# Patient Record
Sex: Male | Born: 1975 | Race: Black or African American | Hispanic: No | Marital: Married | Smoking: Former smoker
Health system: Southern US, Community
[De-identification: ages and names within clinical notes are randomized; demographics above are authoritative.]

## PROBLEM LIST (undated history)

## (undated) ENCOUNTER — Emergency Department (HOSPITAL_COMMUNITY): Discharge status: Against Medical Advice | Payer: Self-pay

## (undated) DIAGNOSIS — K219 Gastro-esophageal reflux disease without esophagitis: Secondary | ICD-10-CM

## (undated) DIAGNOSIS — K2970 Gastritis, unspecified, without bleeding: Secondary | ICD-10-CM

## (undated) DIAGNOSIS — B9681 Helicobacter pylori [H. pylori] as the cause of diseases classified elsewhere: Secondary | ICD-10-CM

## (undated) HISTORY — DX: Gastritis, unspecified, without bleeding: K29.70

## (undated) HISTORY — DX: Helicobacter pylori (H. pylori) as the cause of diseases classified elsewhere: B96.81

---

## 2010-07-22 ENCOUNTER — Encounter: Payer: Self-pay | Admitting: Urgent Care

## 2010-07-22 ENCOUNTER — Ambulatory Visit: Payer: Self-pay | Admitting: Urgent Care

## 2010-07-22 ENCOUNTER — Ambulatory Visit (INDEPENDENT_AMBULATORY_CARE_PROVIDER_SITE_OTHER): Payer: 59 | Admitting: Urgent Care

## 2010-07-22 VITALS — BP 137/77 | HR 80 | Temp 97.5°F | Ht 74.0 in | Wt 207.6 lb

## 2010-07-22 DIAGNOSIS — R197 Diarrhea, unspecified: Secondary | ICD-10-CM

## 2010-07-22 DIAGNOSIS — K625 Hemorrhage of anus and rectum: Secondary | ICD-10-CM

## 2010-07-22 DIAGNOSIS — K529 Noninfective gastroenteritis and colitis, unspecified: Secondary | ICD-10-CM | POA: Insufficient documentation

## 2010-07-22 DIAGNOSIS — K219 Gastro-esophageal reflux disease without esophagitis: Secondary | ICD-10-CM

## 2010-07-22 NOTE — Progress Notes (Signed)
Referring Provider: Josefa Half* Primary Care Physician:  n/a Primary Gastroenterologist:  Dr. Darrick Penna  Chief Complaint  Patient presents with  . Rectal Bleeding   HPI:  Albert Avila is a 35 y.o. male here as a referral from Dr. Wende Crease for rectal bleeding and abdominal pain.  2 days ago at work, had loose stool with a large amt of bright red blood.  Also had low abd cramping before BM.  Has had diarrhea w/ about 3 loose stools daily for approx 4 months now.  Some heartburn that day & several times per mo.  Worse w/ spicy foods.  Intermittent diarrhea x 3-4 mo.  IBU 1-2 prn a couple times per mo.  NO other NSAIDs.  No recent abx.  Not foreign travel.  No new meds.  Denies fever or chills.  Denies n/v, dysphagia or odynophagia.  Not taking any meds for stomach.  C/o burning rectal pain.  Wt stable.  Appetite ok.  Just started omeprazole yesterday.  Started cipro/flagyl yesterday too.  No past medical history on file. Denies any.  No past surgical history on file. Denies any.  Current Outpatient Prescriptions  Medication Sig Dispense Refill  . ciprofloxacin (CIPRO) 500 MG tablet Take 500 mg by mouth 2 (two) times daily.        . metroNIDAZOLE (FLAGYL) 500 MG tablet Take 500 mg by mouth 3 (three) times daily.        Marland Kitchen omeprazole (PRILOSEC) 40 MG capsule Take 40 mg by mouth daily.         Allergies as of 07/22/2010  . (No Known Allergies)   Family History  Problem Relation Age of Onset  . Breast cancer Mother   . Cancer Father     ? GI-unsure   History   Social History  . Marital Status: Married    Spouse Name: N/A    Number of Children: 3  . Years of Education: N/A   Occupational History  . truck driver     [name]   Social History Main Topics  . Smoking status: Current Everyday Smoker -- 0.5 packs/day for 10 years    Types: Cigarettes  . Smokeless tobacco: Not on file  . Alcohol Use: Yes     socially, couple beers about a 6 pk/wk  . Drug Use: No  . Sexually  Active: Not on file  Review of Systems: Gen: Denies any fever, chills, sweats, anorexia, fatigue, weakness, malaise, weight loss, and sleep disorder CV: Denies chest pain, angina, palpitations, syncope, orthopnea, PND, peripheral edema, and claudication. Resp: Denies dyspnea at rest, dyspnea with exercise, cough, sputum, wheezing, coughing up blood, and pleurisy. GI: Denies vomiting blood, jaundice, and fecal incontinence.   Denies dysphagia or odynophagia. GU : Denies urinary burning, blood in urine, urinary frequency, urinary hesitancy, nocturnal urination, and urinary incontinence. MS: Denies joint pain, limitation of movement, and swelling, stiffness, low back pain, extremity pain. Denies muscle weakness, cramps, atrophy.  Derm: Denies rash, itching, dry skin, hives, moles, warts, or unhealing ulcers.  Psych: Denies depression, anxiety, memory loss, suicidal ideation, hallucinations, paranoia, and confusion. Heme: Denies bruising and enlarged lymph nodes.  Physical Exam: BP 137/77  Pulse 80  Temp(Src) 97.5 F (36.4 C) (Temporal)  Ht 6\' 2"  (1.88 m)  Wt 207 lb 9.6 oz (94.167 kg)  BMI 26.65 kg/m2 General:   Alert,  Well-developed, well-nourished, pleasant and cooperative in NAD. Head:  Normocephalic and atraumatic. Eyes:  Sclera clear, no icterus.   Conjunctiva pink. Ears:  Normal auditory acuity. Nose:  No deformity, discharge,  or lesions. Mouth:  No deformity or lesions, dentition normal. Neck:  Supple; no masses or thyromegaly. Lungs:  Clear throughout to auscultation.   No wheezes, crackles, or rhonchi. No acute distress. Heart:  Regular rate and rhythm; no murmurs, clicks, rubs,  or gallops. Abdomen:  Soft, nontender and nondistended. No masses, hepatosplenomegaly or hernias noted. Normal bowel sounds, without guarding, and without rebound.   Rectal: No internal or external lesions palpated.  Rectal vault clear. Msk:  Symmetrical without gross deformities. Normal  posture. Pulses:  Normal pulses noted. Extremities:  Without clubbing or edema. Neurologic:  Alert and  oriented x4;  grossly normal neurologically. Skin:  Intact without significant lesions or rashes. Cervical Nodes:  No significant cervical adenopathy. Psych:  Alert and cooperative. Normal mood and affect.

## 2010-07-22 NOTE — Assessment & Plan Note (Addendum)
Heartburn several days per week.  Just started PPI.  Advised takes up to 10 days to be effective.  Seems to be helping some.  If significant drop in hgb or nothing found on colonoscopy to explain bleeding, would consider EGD to r/o PUD or gastritis.  Continue omeprazole 20mg  daily

## 2010-07-22 NOTE — Assessment & Plan Note (Signed)
See diarrhea  Check CBC, CMP

## 2010-07-22 NOTE — Assessment & Plan Note (Signed)
Albert Avila is a 35 y.o. black male w/ 4 month hx chronic diarrhea & has now had an episode of large volume bright red bleeding this week.  Rectal exam benign.  Differentials include ischemic or infectious colitis, inflammatory bowel disease, diverticular bleeding, or colorectal polyp or cancer. I doubt rapid transit upper GI source.  Full set of stool studies Colonoscopy w/ Dr Darrick Penna.  I have discussed risks & benefits which include, but are not limited to, bleeding, infection, perforation & drug reaction.  The patient agrees with this plan & written consent will be obtained.

## 2010-07-22 NOTE — Patient Instructions (Addendum)
Return stools as soon as possible to the lab Continue omeprazole 20mg  daily Hold cipro & flagyl until stools return Bloody Stools Blood in the poop (bloody stools) is a sign that there is a problem somewhere in the digestive system. The blood might be black, medium red, or bright red. You might also have pain, weakness, throwing up (vomiting), or fever. There are many things that can cause blood in the poop. It is important to figure out what is causing the bleeding. Your doctor might do some tests to figure out what is wrong. When your doctor knows why the bleeding is happening, you will get help to stop the bleeding. HOME CARE  Take medicines, as told by your doctor.   Eat food with lots of fiber (like prunes and bran cereals) and drink lots of liquids.   Sit in a bathtub with warm water for 10-15 minutes, to soak and clean the area, as told by your doctor.   Watch for signs that you are getting better or getting worse.  GET HELP RIGHT AWAY IF:  Your bleeding continues or gets worse.   You throw up (vomit) blood.   You feel weak or dizzy.   You have a temperature by mouth above 101, not controlled by medicine.   You have strong pain.  MAKE SURE YOU:   Understand these instructions.   Will watch your condition.   Will get help right away if you are not doing well or get worse.  Document Released: 12/08/2008 Document Re-Released: 01/11/2009 [facility] Patient Information 2011 [place], [place]. Chronic Diarrhea Diarrhea is loose, watery stools. Having diarrhea means passing loose stools 3 or more times a day. Diarrhea that lasts longer than 4 weeks is considered long-lasting (chronic). Symptoms of chronic diarrhea may be continual or may come and go. People of all ages can get diarrhea. Body fluid loss (dehydration) may occur as a result of diarrhea. This means the body does not have as many fluids and salts (electrolytes) as it needs. CAUSES There are many causes of chronic  diarrhea. Causes may be different for children and adults. The various causes can be grouped into 2 categories: diarrhea caused by an infection and diarrhea not caused by an infection. Sometimes, the cause is unknown. Diarrhea caused by an infection may result from:  Parasites.   Bacteria.   Viral infections.  Diarrhea not caused by an infection may result from:  Irritable bowel syndrome.   Reaction to medicines, such as antibiotics, cancer drugs, blood pressure medicines, and antacids.   Intestinal disease (Crohn's disease, ulcerative colitis, celiac disease).   Food allergies or sensitivity to additives (fructose, lactose, sugar substitutes).   Tumors.   Diabetes, thyroid disease, and other endocrine diseases.   Reduced blood flow to the intestine.   Previous surgery or radiation of the abdomen or gastrointestinal tract.  Risk factors for chronic diarrhea include:  Having a severely weakened immune system, such as from HIV/AIDS.   Taking certain types of cancer-fighting drugs (chemotherapy) or other medicines.   A recent organ transplant.   Having a portion of the stomach removed.   Traveling to countries where food and water supplies are often contaminated.  SYMPTOMS In addition to frequent, loose stools, diarrhea may cause:  Cramping.   Abdominal pain.   Nausea.   Urgent need to use the bathroom, or loss of bowel control.  If dehydration occurs, problems include:  Thirst.   Less frequent urination.   Dark urine.   Dry skin.  Fatigue.   Dizziness.  Infections that cause diarrhea may also cause a fever, chills, or bloody stools. DIAGNOSIS Diagnosis may be difficult. Your caregiver must take a careful history and perform a physical exam. Tests given are based on your symptoms and history. Tests may include:  Blood or stool tests, in which 3 or more stool samples may be examined. Stool cultures may be used to test for bacteria or parasites.   X-rays.     A procedure in which a thin tube is inserted into the mouth or rectum (endoscopy). This allows the caregiver to look inside the intestine.  TREATMENT  Diarrhea caused by an infection can often be treated with antibiotics.   Diarrhea not caused by an infection is more difficult to diagnose and treat. Long-term medicine use or surgery may be required. Specific treatment should be discussed with your caregiver.   If the cause cannot determined, treatment to relieve symptoms includes:   Preventing dehydration. Serious health problems can occur if you do not maintain proper fluid levels. Many oral rehydration solutions (ORS) are available at drug stores. Ask your caregiver what product is best for you.   Not drinking beverages that contain caffeine (tea, coffee, soft drinks).   Not drinking alcohol. It causes dehydration.   Not relying on sports drinks and broths alone to maintain proper fluid levels. They should not be used to prevent severe dehydration.   Maintaining well-balanced nutrition. This may help you recover faster.  PREVENTION  Drink clean or purified water.   Use proper food handling techniques.   Maintain proper hand-washing habits.  HOME CARE INSTRUCTIONS  Avoid:   Caffeine.   Greasy foods.   High fiber.   If you have problems digesting lactose during or after an episode of diarrhea, you might want to try yogurt. Yogurt is often better tolerated, because it has less lactose than milk. Yogurt with active, live bacterial cultures may even help you recover faster.  SEEK MEDICAL CARE IF: The person with diarrhea is an otherwise healthy adult and has:  Signs of dehydration.   Diarrhea for more than 2 days.   Severe pain in the abdomen or rectum.   An oral temperature above 101.   Stools containing blood or pus.   Stools that are black and tarry.  SEEK IMMEDIATE MEDICAL CARE IF: The person with diarrhea is a child, elderly person, or has a weakened immune  system and has:  Signs of dehydration.   Diarrhea for more than 1 day.   Severe pain in the abdomen or rectum.   An oral temperature above 101, not controlled by medicine.   Stools containing blood or pus.   Stools that are black and tarry.  Document Released: 03/12/2003 Document Re-Released: 06/09/2009 [facility] Patient Information 2011 [place], [place].

## 2010-07-22 NOTE — Progress Notes (Signed)
Cc to Dr. Wende Crease

## 2010-07-27 ENCOUNTER — Telehealth: Payer: Self-pay

## 2010-07-27 LAB — COMPREHENSIVE METABOLIC PANEL
AST: 20 U/L (ref 0–37)
BUN: 16 mg/dL (ref 6–23)
Calcium: 8.9 mg/dL (ref 8.4–10.5)
Chloride: 108 mEq/L (ref 96–112)
Creat: 1.09 mg/dL (ref 0.50–1.35)

## 2010-07-27 LAB — CBC WITH DIFFERENTIAL/PLATELET
Basophils Relative: 1 % (ref 0–1)
HCT: 37.2 % — ABNORMAL LOW (ref 39.0–52.0)
Hemoglobin: 12.9 g/dL — ABNORMAL LOW (ref 13.0–17.0)
Lymphocytes Relative: 36 % (ref 12–46)
MCHC: 34.7 g/dL (ref 30.0–36.0)
Monocytes Absolute: 0.5 10*3/uL (ref 0.1–1.0)
Monocytes Relative: 9 % (ref 3–12)
Neutro Abs: 3.1 10*3/uL (ref 1.7–7.7)

## 2010-07-27 LAB — CLOSTRIDIUM DIFFICILE BY PCR: Toxigenic C. Difficile by PCR: DETECTED — CR

## 2010-07-27 LAB — GIARDIA ANTIGEN: Giardia Screen (EIA): NEGATIVE

## 2010-07-27 NOTE — Telephone Encounter (Signed)
Albert Avila from [facility] called and reported a positive C-Diff on pt. Pt seen by Lorenza Burton on 07/22/2010.

## 2010-07-27 NOTE — Telephone Encounter (Signed)
Pt seen by KJ. Pos CDIFF. LVM at home ph. Needs to stop CIP. Needs Flagyl for 10 days-7/24-8/2, 500 mg TID #30, rfx0. Needs TCS for rectal bleeding.

## 2010-07-28 NOTE — Telephone Encounter (Signed)
Called pt at home to answer questions RE: C DIFF. LVM-call [phone] if he has questions.

## 2010-07-28 NOTE — Telephone Encounter (Signed)
LMOM to call.

## 2010-07-28 NOTE — Telephone Encounter (Signed)
Cc results to Referring Physicians

## 2010-07-28 NOTE — Telephone Encounter (Signed)
Pt informed. Rx called to [facility]. Mailed info on C-Diff and he also requested a copy of his Cmet.

## 2010-07-30 LAB — STOOL CULTURE

## 2010-08-02 NOTE — Telephone Encounter (Signed)
Noted  

## 2010-08-05 ENCOUNTER — Other Ambulatory Visit: Payer: Self-pay | Admitting: Gastroenterology

## 2010-08-05 ENCOUNTER — Encounter (HOSPITAL_COMMUNITY): Payer: Self-pay | Admitting: *Deleted

## 2010-08-05 ENCOUNTER — Encounter (HOSPITAL_COMMUNITY)
Admission: RE | Disposition: A | Discharge status: Home / Self Care | Payer: Self-pay | Source: Ambulatory Visit | Attending: Gastroenterology

## 2010-08-05 ENCOUNTER — Ambulatory Visit (HOSPITAL_COMMUNITY)
Admission: RE | Admit: 2010-08-05 | Discharge: 2010-08-05 | Disposition: A | Discharge status: Home / Self Care | Payer: 59 | Source: Ambulatory Visit | Attending: Gastroenterology | Admitting: Gastroenterology

## 2010-08-05 ENCOUNTER — Encounter: Payer: 59 | Admitting: Gastroenterology

## 2010-08-05 DIAGNOSIS — D126 Benign neoplasm of colon, unspecified: Secondary | ICD-10-CM | POA: Insufficient documentation

## 2010-08-05 DIAGNOSIS — R197 Diarrhea, unspecified: Secondary | ICD-10-CM | POA: Insufficient documentation

## 2010-08-05 DIAGNOSIS — K5730 Diverticulosis of large intestine without perforation or abscess without bleeding: Secondary | ICD-10-CM

## 2010-08-05 DIAGNOSIS — D128 Benign neoplasm of rectum: Secondary | ICD-10-CM | POA: Insufficient documentation

## 2010-08-05 DIAGNOSIS — K621 Rectal polyp: Secondary | ICD-10-CM

## 2010-08-05 DIAGNOSIS — K648 Other hemorrhoids: Secondary | ICD-10-CM | POA: Insufficient documentation

## 2010-08-05 DIAGNOSIS — K921 Melena: Secondary | ICD-10-CM | POA: Insufficient documentation

## 2010-08-05 DIAGNOSIS — K620 Anal polyp: Secondary | ICD-10-CM

## 2010-08-05 HISTORY — DX: Gastro-esophageal reflux disease without esophagitis: K21.9

## 2010-08-05 HISTORY — PX: COLONOSCOPY: SHX5424

## 2010-08-05 SURGERY — COLONOSCOPY
Anesthesia: Moderate Sedation

## 2010-08-05 MED ORDER — MIDAZOLAM HCL 5 MG/5ML IJ SOLN
INTRAMUSCULAR | Status: DC | PRN
  Administered 2010-08-05 (×2): 2 mg via INTRAVENOUS
  Administered 2010-08-05: 1 mg via INTRAVENOUS

## 2010-08-05 MED ORDER — MEPERIDINE HCL 100 MG/ML IJ SOLN
INTRAMUSCULAR | Status: AC
  Filled 2010-08-05: qty 2

## 2010-08-05 MED ORDER — MIDAZOLAM HCL 5 MG/5ML IJ SOLN
INTRAMUSCULAR | Status: AC
  Filled 2010-08-05: qty 10

## 2010-08-05 MED ORDER — MEPERIDINE HCL 100 MG/ML IJ SOLN
INTRAMUSCULAR | Status: DC | PRN
  Administered 2010-08-05: 50 mg via INTRAVENOUS
  Administered 2010-08-05: 25 mg via INTRAVENOUS

## 2010-08-05 MED ORDER — STERILE WATER FOR IRRIGATION IR SOLN
Status: DC | PRN
  Administered 2010-08-05: 10:00:00

## 2010-08-05 MED ORDER — SODIUM CHLORIDE 0.45 % IV SOLN
Freq: Once | INTRAVENOUS | Status: AC
  Administered 2010-08-05: 10:00:00 via INTRAVENOUS

## 2010-08-05 NOTE — Progress Notes (Signed)
Cc to PCP 

## 2010-08-05 NOTE — Progress Notes (Signed)
cdiff pcr pos. tcs for rectal bleeding and normocytic anemia. opv in 4 mos and repeat cbc.

## 2010-08-05 NOTE — Progress Notes (Signed)
Pt aware of OV for 12/08/10 @ 245 w/SF and reminder in epic for pt to have CBC done prior to OV

## 2010-08-05 NOTE — H&P (Signed)
Reason for Visit     Rectal Bleeding        Current Vitals       Recorded User        07/22/2010  1:19 PM  Ginger L Walker, NT           BP Pulse Temp (Src) Resp Ht Wt    137/77  80  97.5 F (36.4 C) (Temporal)  N/A  6\' 2"  (1.88 m)  207 lb 9.6 oz (94.167 kg)       BMI SpO2 PF    26.65 kg/m2  N/A  N/A          Progress Notes     Lorenza Burton, NP  07/22/2010  2:53 PM  Signed Referring Provider: Josefa Half* Primary Care Physician:  n/a Primary Gastroenterologist:  Dr. Darrick Penna    Chief Complaint   Patient presents with   .  Rectal Bleeding    HPI:  Albert Avila is a 35 y.o. male here as a referral from Dr. Wende Crease for rectal bleeding and abdominal pain.  2 days ago at work, had loose stool with a large amt of bright red blood.  Also had low abd cramping before BM.  Has had diarrhea w/ about 3 loose stools daily for approx 4 months now.  Some heartburn that day & several times per mo.  Worse w/ spicy foods.  Intermittent diarrhea x 3-4 mo.  IBU 1-2 prn a couple times per mo.  NO other NSAIDs.  No recent abx.  Not foreign travel.  No new meds.  Denies fever or chills.  Denies n/v, dysphagia or odynophagia.  Not taking any meds for stomach.  C/o burning rectal pain.  Wt stable.  Appetite ok.  Just started omeprazole yesterday.  Started cipro/flagyl yesterday too.   No past medical history on file. Denies any.   No past surgical history on file. Denies any.    Current Outpatient Prescriptions   Medication  Sig  Dispense  Refill   .  ciprofloxacin (CIPRO) 500 MG tablet  Take 500 mg by mouth 2 (two) times daily.           .  metroNIDAZOLE (FLAGYL) 500 MG tablet  Take 500 mg by mouth 3 (three) times daily.           Marland Kitchen  omeprazole (PRILOSEC) 40 MG capsule  Take 40 mg by mouth daily.            Allergies as of 07/22/2010   .  (No Known Allergies)    Family History   Problem  Relation  Age of Onset   .  Breast cancer  Mother     .  Cancer  Father         ?  GI-unsure    History       Social History   .  Marital Status:  Married       Spouse Name:  N/A       Number of Children:  3   .  Years of Education:  N/A       Occupational History   .  truck driver         [name]       Social History Main Topics   .  Smoking status:  Current Everyday Smoker -- 0.5 packs/day for 10 years       Types:  Cigarettes   .  Smokeless tobacco:  Not on file   .  Alcohol Use:  Yes         socially, couple beers about a 6 pk/wk   .  Drug Use:  No   .  Sexually Active:  Not on file    Review of Systems: Gen: Denies any fever, chills, sweats, anorexia, fatigue, weakness, malaise, weight loss, and sleep disorder CV: Denies chest pain, angina, palpitations, syncope, orthopnea, PND, peripheral edema, and claudication. Resp: Denies dyspnea at rest, dyspnea with exercise, cough, sputum, wheezing, coughing up blood, and pleurisy. GI: Denies vomiting blood, jaundice, and fecal incontinence.   Denies dysphagia or odynophagia. GU : Denies urinary burning, blood in urine, urinary frequency, urinary hesitancy, nocturnal urination, and urinary incontinence. MS: Denies joint pain, limitation of movement, and swelling, stiffness, low back pain, extremity pain. Denies muscle weakness, cramps, atrophy.   Derm: Denies rash, itching, dry skin, hives, moles, warts, or unhealing ulcers.   Psych: Denies depression, anxiety, memory loss, suicidal ideation, hallucinations, paranoia, and confusion. Heme: Denies bruising and enlarged lymph nodes.   Physical Exam: BP 137/77  Pulse 80  Temp(Src) 97.5 F (36.4 C) (Temporal)  Ht 6\' 2"  (1.88 m)  Wt 207 lb 9.6 oz (94.167 kg)  BMI 26.65 kg/m2 General:   Alert,  Well-developed, well-nourished, pleasant and cooperative in NAD. Head:  Normocephalic and atraumatic. Eyes:  Sclera clear, no icterus.   Conjunctiva pink. Ears:  Normal auditory acuity. Nose:  No deformity, discharge,  or lesions. Mouth:  No deformity or lesions,  dentition normal. Neck:  Supple; no masses or thyromegaly. Lungs:  Clear throughout to auscultation.   No wheezes, crackles, or rhonchi. No acute distress. Heart:  Regular rate and rhythm; no murmurs, clicks, rubs,  or gallops. Abdomen:  Soft, nontender and nondistended. No masses, hepatosplenomegaly or hernias noted. Normal bowel sounds, without guarding, and without rebound.    Rectal: No internal or external lesions palpated.  Rectal vault clear. Msk:  Symmetrical without gross deformities. Normal posture. Pulses:  Normal pulses noted. Extremities:  Without clubbing or edema. Neurologic:  Alert and  oriented x4;  grossly normal neurologically. Skin:  Intact without significant lesions or rashes. Cervical Nodes:  No significant cervical adenopathy. Psych:  Alert and cooperative. Normal mood and affect.     Glendora Score  07/22/2010  3:06 PM  Signed Cc to Dr. Wende Crease        Chronic diarrhea - Lorenza Burton, NP  07/22/2010  2:47 PM  Signed Albert Avila is a 35 y.o. black male w/ 4 month hx chronic diarrhea & has now had an episode of large volume bright red bleeding this week.  Rectal exam benign.  Differentials include ischemic or infectious colitis, inflammatory bowel disease, diverticular bleeding, or colorectal polyp or cancer. I doubt rapid transit upper GI source.   Full set of stool studies Colonoscopy w/ Dr Darrick Penna.  I have discussed risks & benefits which include, but are not limited to, bleeding, infection, perforation & drug reaction.  The patient agrees with this plan & written consent will be obtained.       Rectal bleeding - Lorenza Burton, NP  07/22/2010  2:47 PM  Signed See diarrhea   Check CBC, CMP  GERD (gastroesophageal reflux disease) - Lorenza Burton, NP  07/22/2010  2:50 PM  Addendum Heartburn several days per week.  Just started PPI.  Advised takes up to 10 days to be effective.  Seems to be helping some.  If significant drop in hgb or nothing found on colonoscopy  to explain bleeding,  would consider EGD to r/o PUD or gastritis.   Continue omeprazole 20mg  daily

## 2010-08-05 NOTE — Interval H&P Note (Signed)
History and Physical Interval Note: CDIFF PCR POS. STOOL CX NEG, NORMOCYTIC ANEMIA in setting of rectal bleeding.    08/05/2010   9:46 AM   Albert Avila  has presented today for surgery, with the diagnosis of diarrhea and rectal bleeding.  The various methods of treatment have been discussed with the patient and family. After consideration of risks, benefits and other options for treatment, the patient has consented to  Procedure(s): COLONOSCOPY as a surgical intervention .  I have reviewed the patients' chart and labs.  Questions were answered to the patient's satisfaction.     [name]  MD

## 2010-08-10 ENCOUNTER — Telehealth: Payer: Self-pay | Admitting: Gastroenterology

## 2010-08-10 NOTE — Telephone Encounter (Signed)
Reminder in epic to have next tcs in 10 years °

## 2010-08-10 NOTE — Telephone Encounter (Signed)
Please call pt. H ehad hyperplastic polyps removed. TCS in 10 years. High fiber diet.

## 2010-08-10 NOTE — Telephone Encounter (Signed)
Results Cc to Ref Dr. Wende Crease

## 2010-08-11 ENCOUNTER — Encounter (HOSPITAL_COMMUNITY): Payer: Self-pay | Admitting: Gastroenterology

## 2010-08-11 NOTE — Telephone Encounter (Signed)
Pt aware.

## 2010-09-23 ENCOUNTER — Encounter: Payer: Self-pay | Admitting: Gastroenterology

## 2010-10-06 ENCOUNTER — Telehealth: Payer: Self-pay | Admitting: Gastroenterology

## 2010-10-06 NOTE — Telephone Encounter (Signed)
Pt is aware of OV for 11/1 at 245 with SF, but needs CBC prior to OV

## 2010-10-07 ENCOUNTER — Other Ambulatory Visit: Payer: Self-pay

## 2010-10-07 DIAGNOSIS — K625 Hemorrhage of anus and rectum: Secondary | ICD-10-CM

## 2010-10-07 NOTE — Telephone Encounter (Signed)
Mailed lab order to pt to get done prior t office visit on 11/04/2010.

## 2010-10-21 ENCOUNTER — Other Ambulatory Visit: Payer: Self-pay | Admitting: Gastroenterology

## 2010-10-22 LAB — CBC WITH DIFFERENTIAL/PLATELET
Basophils Absolute: 0 10*3/uL (ref 0.0–0.1)
Basophils Relative: 0 % (ref 0–1)
Eosinophils Absolute: 0.2 10*3/uL (ref 0.0–0.7)
Eosinophils Relative: 2 % (ref 0–5)
HCT: 39.8 % (ref 39.0–52.0)
Hemoglobin: 13.6 g/dL (ref 13.0–17.0)
Lymphocytes Relative: 28 % (ref 12–46)
Lymphs Abs: 2.3 10*3/uL (ref 0.7–4.0)
MCH: 31.8 pg (ref 26.0–34.0)
MCHC: 34.2 g/dL (ref 30.0–36.0)
MCV: 93 fL (ref 78.0–100.0)
Monocytes Absolute: 0.7 10*3/uL (ref 0.1–1.0)
Monocytes Relative: 9 % (ref 3–12)
Neutro Abs: 5 10*3/uL (ref 1.7–7.7)
Neutrophils Relative %: 61 % (ref 43–77)
Platelets: 248 10*3/uL (ref 150–400)
RBC: 4.28 MIL/uL (ref 4.22–5.81)
RDW: 13.2 % (ref 11.5–15.5)
WBC: 8.2 10*3/uL (ref 4.0–10.5)

## 2010-11-01 NOTE — Progress Notes (Signed)
Quick Note:  Forwarding to Ginger to call. ______ 

## 2010-11-04 ENCOUNTER — Ambulatory Visit (INDEPENDENT_AMBULATORY_CARE_PROVIDER_SITE_OTHER): Payer: 59 | Admitting: Gastroenterology

## 2010-11-04 ENCOUNTER — Encounter: Payer: Self-pay | Admitting: Gastroenterology

## 2010-11-04 DIAGNOSIS — K529 Noninfective gastroenteritis and colitis, unspecified: Secondary | ICD-10-CM

## 2010-11-04 DIAGNOSIS — R197 Diarrhea, unspecified: Secondary | ICD-10-CM

## 2010-11-04 DIAGNOSIS — D649 Anemia, unspecified: Secondary | ICD-10-CM

## 2010-11-04 NOTE — Progress Notes (Signed)
Cc to PCP 

## 2010-11-04 NOTE — Progress Notes (Signed)
  Subjective:    Patient ID: Albert Avila, male    DOB: September 24, 1975, 35 y.o.   MRN: [id number]  PCP: NONE  HPI WORKS [employer] AT [employer]. Dx w/ CDIFF JUL 2012. CURRENTLY, no diarrhea or abd pain. Appetite: good. No questions or concerns. No asa, BC, or Goody's, rare ibuprofen. Rare EtOH.  Past Medical History  Diagnosis Date  . GERD (gastroesophageal reflux disease)    Past Surgical History  Procedure Date  . Colonoscopy 08/05/2010    Procedure: COLONOSCOPY;  Surgeon: Arlyce Harman, MD;  Location: [facility];  Service: Endoscopy;  Laterality: N/A;    No Known Allergies  Current Outpatient Prescriptions  Medication Sig Dispense Refill  . omeprazole (PRILOSEC) 40 MG capsule Take 40 mg by mouth daily.             Review of Systems     Objective:   Physical Exam  Constitutional: He appears well-developed and well-nourished. No distress.  HENT:  Head: Normocephalic and atraumatic.  Cardiovascular: Normal rate, regular rhythm and normal heart sounds.   Pulmonary/Chest: Effort normal and breath sounds normal.  Abdominal: Soft. Bowel sounds are normal. He exhibits no distension. There is no tenderness.          Assessment & Plan:

## 2010-11-04 NOTE — Progress Notes (Signed)
Reminder in epic to follow up in 6 months °

## 2010-11-04 NOTE — Assessment & Plan Note (Signed)
RESOLVED 2o TO cdiff.

## 2010-11-04 NOTE — Assessment & Plan Note (Addendum)
No active gi bleed or infection.  OPV IN 6 MOS TO RECHECK CBC. IF ANEMIA PERSISTS, PT WILL NEED AN EGD. REFER TO DR. Jeanice Lim TO BE HIS PCP. PT DESIRES TO STOP SMOKING.

## 2010-12-08 ENCOUNTER — Ambulatory Visit: Payer: 59 | Admitting: Gastroenterology

## 2012-01-25 ENCOUNTER — Emergency Department
Admission: EM | Admit: 2012-01-25 | Discharge: 2012-01-25 | Disposition: A | Discharge status: Home / Self Care | Payer: Self-pay | Source: Home / Self Care | Attending: Family Medicine | Admitting: Family Medicine

## 2012-01-25 ENCOUNTER — Encounter: Payer: Self-pay | Admitting: Emergency Medicine

## 2012-01-25 DIAGNOSIS — Z202 Contact with and (suspected) exposure to infections with a predominantly sexual mode of transmission: Secondary | ICD-10-CM

## 2012-01-25 DIAGNOSIS — A599 Trichomoniasis, unspecified: Secondary | ICD-10-CM

## 2012-01-25 DIAGNOSIS — Z2089 Contact with and (suspected) exposure to other communicable diseases: Secondary | ICD-10-CM

## 2012-01-25 MED ORDER — METRONIDAZOLE 500 MG PO TABS: 500.0000 mg | ORAL_TABLET | Freq: Two times a day (BID) | ORAL | Status: DC

## 2012-01-25 MED ORDER — AZITHROMYCIN 500 MG PO TABS: ORAL_TABLET | ORAL | Status: DC

## 2012-01-25 NOTE — ED Notes (Signed)
STD Testing, was told by his partner that she had an Irregular Pap

## 2012-01-25 NOTE — ED Provider Notes (Signed)
History     CSN: [id number]  Arrival date & time 01/25/12  1652   First MD Initiated Contact with Patient 01/25/12 1701      Chief Complaint  Patient presents with  . SEXUALLY TRANSMITTED DISEASE   HPI  Pt presents today with possible STD exposure.  Pt reports that his girlfriend got an abnormal pap smear report.  Pt has been with this partner for the past 6 months. This has been a monogamous relationship.  Pt was married prior to this in a monogamous relationship.  No prior history of STDs per pt.  Pt is unsure of girlfriend's prior history.  No penile discharge, dysuria, inguinal LAD.    Past Medical History  Diagnosis Date  . GERD (gastroesophageal reflux disease)     Past Surgical History  Procedure Date  . Colonoscopy 08/05/2010    Procedure: COLONOSCOPY;  Surgeon: Arlyce Harman, MD;  Location: [facility];  Service: Endoscopy;  Laterality: N/A;    Family History  Problem Relation Age of Onset  . Breast cancer Mother   . Cancer Father     ? GI-unsure    History  Substance Use Topics  . Smoking status: Current Every Day Smoker -- 0.5 packs/day for 10 years    Types: Cigarettes  . Smokeless tobacco: Not on file  . Alcohol Use: Yes     Comment: socially, couple beers about a 6 pk/wk      Review of Systems  All other systems reviewed and are negative.    Allergies  Review of patient's allergies indicates not on file.  Home Medications   Current Outpatient Rx  Name  Route  Sig  Dispense  Refill  . OMEPRAZOLE 40 MG PO CPDR   Oral   Take 40 mg by mouth daily.             BP 124/82  Pulse 81  Temp 98.6 F (37 C) (Oral)  Resp 16  Ht 6' (1.829 m)  Wt 204 lb (92.534 kg)  BMI 27.67 kg/m2  SpO2 96%  Physical Exam  Constitutional: He appears well-developed and well-nourished.  HENT:  Head: Normocephalic and atraumatic.  Eyes: Conjunctivae normal are normal. Pupils are equal, round, and reactive to light.  Neck: Normal range of motion.  Neck supple.  Cardiovascular: Normal rate and regular rhythm.   Pulmonary/Chest: Effort normal and breath sounds normal.  Abdominal: Soft.  Genitourinary: Penis normal. No penile tenderness.       No inguinal LAD  Musculoskeletal: Normal range of motion.  Neurological: He is alert.    ED Course  Procedures (including critical care time)  Labs Reviewed - No data to display No results found.   1. Exposure to STD   2. Trichimoniasis       MDM  Urine gc/chl, HIV, rpr obtained.  Rocephin 250mg  IM x1 Azithro 1gm  Flagyl 500mg  BID x 7 days for trich coverage.  Discussed safe sex practices.  Follow up as needed.     The patient and/or caregiver has been counseled thoroughly with regard to treatment plan and/or medications prescribed including dosage, schedule, interactions, rationale for use, and possible side effects and they verbalize understanding. Diagnoses and expected course of recovery discussed and will return if not improved as expected or if the condition worsens. Patient and/or caregiver verbalized understanding.             Doree Albee, MD 01/25/12 [phone]

## 2012-01-26 LAB — HIV ANTIBODY (ROUTINE TESTING W REFLEX): HIV: NONREACTIVE

## 2012-01-26 LAB — RPR

## 2012-01-27 ENCOUNTER — Telehealth: Payer: Self-pay | Admitting: *Deleted

## 2015-02-13 ENCOUNTER — Encounter: Payer: Self-pay | Admitting: Gastroenterology

## 2015-02-25 ENCOUNTER — Other Ambulatory Visit: Payer: Self-pay

## 2015-02-25 ENCOUNTER — Ambulatory Visit (INDEPENDENT_AMBULATORY_CARE_PROVIDER_SITE_OTHER): Payer: Commercial Managed Care - HMO | Admitting: Nurse Practitioner

## 2015-02-25 ENCOUNTER — Telehealth: Payer: Self-pay

## 2015-02-25 ENCOUNTER — Encounter: Payer: Self-pay | Admitting: Nurse Practitioner

## 2015-02-25 VITALS — BP 121/79 | HR 91 | Temp 98.0°F | Ht 74.0 in | Wt 254.6 lb

## 2015-02-25 DIAGNOSIS — K625 Hemorrhage of anus and rectum: Secondary | ICD-10-CM | POA: Diagnosis not present

## 2015-02-25 DIAGNOSIS — K921 Melena: Secondary | ICD-10-CM

## 2015-02-25 DIAGNOSIS — R194 Change in bowel habit: Secondary | ICD-10-CM

## 2015-02-25 DIAGNOSIS — R109 Unspecified abdominal pain: Secondary | ICD-10-CM | POA: Insufficient documentation

## 2015-02-25 DIAGNOSIS — R1030 Lower abdominal pain, unspecified: Secondary | ICD-10-CM

## 2015-02-25 MED ORDER — PEG 3350-KCL-NA BICARB-NACL 420 G PO SOLR: 4000.0000 mL | Freq: Once | ORAL | Status: DC

## 2015-02-25 NOTE — Progress Notes (Signed)
cc'ed to pcp °

## 2015-02-25 NOTE — Assessment & Plan Note (Signed)
Patient with lower abdominal cramping, sometimes relieved after bowel movement. In addition has had change in bowel habits with softer/mushy stools and rectal bleeding all occurring within the past couple months. Last colonoscopy 5 years ago. Given his presentation we'll proceed with colonoscopy to further evaluate as noted above. Return for follow-up in 2 months.

## 2015-02-25 NOTE — Assessment & Plan Note (Signed)
Change in bowel habits as of 1-2 months ago after antibiotics for H. pylori. Has tried a probiotic without relief. Possible irritable bowel syndrome/antibiotic effect and hemorrhoids versus other more insidious process. Proceed with colonoscopy as noted above. Return for follow-up in 2 months.

## 2015-02-25 NOTE — Telephone Encounter (Signed)
TCS approval per [facility] online is [id number]

## 2015-02-25 NOTE — Progress Notes (Signed)
Primary Care Physician:  Curlene Labrum, MD Primary Gastroenterologist:  Dr. Oneida Alar  Chief Complaint  Patient presents with  . Blood In Stools    HPI:   Albert Avila is a 40 y.o. male who presents for abdominal pain and blood in stools. He was referred by PCP, primary care notes reviewed. Last saw PCP 12/22/2014 for epigastric pain described as sharp, crampy for 2-3 weeks. Also noted bright red blood in stool, has had this before and has had a colonoscopy in 2012. Blood is mostly on the tissue, 2 episodes last week. Per PCP notes patient with gastric ulcer positive H. pylori 11/2014 status post triple therapy with symptoms resolved 12/2014. No history of endoscopy noted our system. Last colonoscopy completed 08-2010 which found polyps in the rectum and sigmoid colon, diverticulosis in the sigmoid and descending colon, internal hemorrhoids. Recommended high-fiber diet. Surgical pathology polyps were found to be hyperplastic. Recommended 10 year repeat colonoscopy.  Today he states he was diagnosed with a gastric ulcer based on bloodwork. Treated with antibiotics, symptoms resolved. Denies epigastric pain at this time. Notes lower abdominal cramping and afterward hematochezia. Has persistently loose/"mushy" bowel movements which started a couple months ago. Has tried probiotic without relief.  Has bowel movements twice daily. Tends to have symptoms after eating. Blood is on the toilet tissue and stool. Pain improved after bowel movement. Denies other abdominal pain, melena, N/V, unintentional weight loss, fever, chills. Denies chest pain, dyspnea, dizziness, lightheadedness, syncope, near syncope. Denies any other upper or lower GI symptoms.  Past Medical History  Diagnosis Date  . GERD (gastroesophageal reflux disease)   . Helicobacter pylori gastritis     treated with triple therapy    Past Surgical History  Procedure Laterality Date  . Colonoscopy  08/05/2010    Procedure: COLONOSCOPY;   Surgeon: Dorothyann Peng, MD;  Location: [facility];  Service: Endoscopy;  Laterality: N/A;    Current Outpatient Prescriptions  Medication Sig Dispense Refill  . omeprazole (PRILOSEC) 40 MG capsule Take 40 mg by mouth daily.      Marland Kitchen azithromycin (ZITHROMAX) 500 MG tablet 2 tabs po x1 (Patient not taking: Reported on 02/25/2015) 2 tablet 0  . metroNIDAZOLE (FLAGYL) 500 MG tablet Take 1 tablet (500 mg total) by mouth 2 (two) times daily. (Patient not taking: Reported on 02/25/2015) 14 tablet 0  . polyethylene glycol-electrolytes (NULYTELY/GOLYTELY) 420 g solution Take 4,000 mLs by mouth once. 4000 mL 0   No current facility-administered medications for this visit.    Allergies as of 02/25/2015  . (No Known Allergies)    Family History  Problem Relation Age of Onset  . Breast cancer Mother   . Cancer Father     ? GI-unsure    Social History   Social History  . Marital Status: Legally Separated    Spouse Name: N/A  . Number of Children: 3  . Years of Education: N/A   Occupational History  . truck driver     [name]   Social History Main Topics  . Smoking status: Former Smoker -- 0.50 packs/day for 10 years    Types: Cigarettes    Quit date: 02/25/2012  . Smokeless tobacco: Never Used  . Alcohol Use: 0.0 oz/week    0 Standard drinks or equivalent per week     Comment: 1 beer once every 2-4 weeks.  . Drug Use: No  . Sexual Activity: Not on file   Other Topics Concern  . Not on file  Social History Narrative    Review of Systems: 10-point ROS negative except as per HPI.    Physical Exam: BP 121/79 mmHg  Pulse 91  Temp(Src) 98 F (36.7 C) (Oral)  Ht 6\' 2"  (1.88 m)  Wt 254 lb 9.6 oz (115.486 kg)  BMI 32.67 kg/m2 General:   Alert and oriented. Pleasant and cooperative. Well-nourished and well-developed.  Head:  Normocephalic and atraumatic. Eyes:  Without icterus, sclera clear and conjunctiva pink.  Ears:  Normal auditory acuity. Cardiovascular:  S1, S2  present without murmurs appreciated. Extremities without clubbing or edema. Respiratory:  Clear to auscultation bilaterally. No wheezes, rales, or rhonchi. No distress.  Gastrointestinal:  +BS, soft, and non-distended. Minimal lower abdominal TTP. No HSM noted. No guarding or rebound. No masses appreciated.  Rectal:  Deferred  Musculoskalatal:  Symmetrical without gross deformities. Skin:  Intact without significant lesions or rashes. Neurologic:  Alert and oriented x4;  grossly normal neurologically. Psych:  Alert and cooperative. Normal mood and affect. Heme/Lymph/Immune: No excessive bruising noted.    02/25/2015 1:40 PM

## 2015-02-25 NOTE — Assessment & Plan Note (Signed)
Patient with rectal bleeding which began approximately one to 2 months ago. Has a history of hemorrhoids but until recently has not had recurrent symptoms. Last colonoscopy 5 years ago with a couple polyps removed which were hyperplastic recommended screening at 10 years. Around the time of bleeding is also had a change in his bowel habits with soft/mushy stools. He is tried a probiotic due to possible anabolic affect after his positive H. pylori, however this is not had the desired effect. Differentials include irritable bowel/antibiotic effect and hemorrhoids versus other more insidious process. At this point we'll proceed with a colonoscopy to further evaluate given his presentation. Return for follow-up in 2 months.  Proceed with colonoscopy with 25 mg pre-procedure Phenergan with Dr. Oneida Alar in the near future. The risks, benefits, and alternatives have been discussed in detail with the patient. They state understanding and desire to proceed.   Patient is not on any anticoagulants, anxiolytics, chronic pain medications. His last colonoscopy was completed under conscious sedation with noted side effects. Patient states he did wake up during the middle. For this reason we'll add 25 mg preprocedure Phenergan to promote adequate sedation.

## 2015-02-25 NOTE — Patient Instructions (Signed)
1. We will schedule your procedure for you. 2. Further recommendations to be based on results of your procedure. 3. Return for follow-up in 2 months to assess your symptoms and review results.

## 2015-03-08 ENCOUNTER — Emergency Department (HOSPITAL_COMMUNITY)
Admission: EM | Admit: 2015-03-08 | Discharge: 2015-03-08 | Disposition: A | Discharge status: Home / Self Care | Payer: Commercial Managed Care - HMO | Attending: Emergency Medicine | Admitting: Emergency Medicine

## 2015-03-08 ENCOUNTER — Encounter (HOSPITAL_COMMUNITY): Payer: Self-pay | Admitting: Emergency Medicine

## 2015-03-08 DIAGNOSIS — Z87891 Personal history of nicotine dependence: Secondary | ICD-10-CM | POA: Insufficient documentation

## 2015-03-08 DIAGNOSIS — N390 Urinary tract infection, site not specified: Secondary | ICD-10-CM | POA: Diagnosis not present

## 2015-03-08 DIAGNOSIS — M545 Low back pain: Secondary | ICD-10-CM | POA: Diagnosis present

## 2015-03-08 DIAGNOSIS — R51 Headache: Secondary | ICD-10-CM | POA: Diagnosis not present

## 2015-03-08 DIAGNOSIS — Z79899 Other long term (current) drug therapy: Secondary | ICD-10-CM | POA: Diagnosis not present

## 2015-03-08 LAB — CBC WITH DIFFERENTIAL/PLATELET
Basophils Absolute: 0 10*3/uL (ref 0.0–0.1)
Basophils Relative: 0 %
EOS PCT: 1 %
Eosinophils Absolute: 0.1 10*3/uL (ref 0.0–0.7)
HCT: 39 % (ref 39.0–52.0)
Hemoglobin: 13 g/dL (ref 13.0–17.0)
LYMPHS ABS: 2.5 10*3/uL (ref 0.7–4.0)
LYMPHS PCT: 27 %
MCH: 30 pg (ref 26.0–34.0)
MCHC: 33.3 g/dL (ref 30.0–36.0)
MCV: 90.1 fL (ref 78.0–100.0)
MONO ABS: 0.7 10*3/uL (ref 0.1–1.0)
MONOS PCT: 7 %
Neutro Abs: 5.9 10*3/uL (ref 1.7–7.7)
Neutrophils Relative %: 65 %
PLATELETS: 280 10*3/uL (ref 150–400)
RBC: 4.33 MIL/uL (ref 4.22–5.81)
RDW: 13.4 % (ref 11.5–15.5)
WBC: 9.1 10*3/uL (ref 4.0–10.5)

## 2015-03-08 LAB — URINALYSIS, ROUTINE W REFLEX MICROSCOPIC
BILIRUBIN URINE: NEGATIVE
Glucose, UA: NEGATIVE mg/dL
Ketones, ur: NEGATIVE mg/dL
Nitrite: NEGATIVE
PROTEIN: NEGATIVE mg/dL
Specific Gravity, Urine: 1.015 (ref 1.005–1.030)
pH: 5.5 (ref 5.0–8.0)

## 2015-03-08 LAB — PSA: PSA: 2.06 ng/mL (ref 0.00–4.00)

## 2015-03-08 LAB — BASIC METABOLIC PANEL
Anion gap: 10 (ref 5–15)
BUN: 13 mg/dL (ref 6–20)
CHLORIDE: 102 mmol/L (ref 101–111)
CO2: 27 mmol/L (ref 22–32)
Calcium: 9.4 mg/dL (ref 8.9–10.3)
Creatinine, Ser: 0.99 mg/dL (ref 0.61–1.24)
GFR calc Af Amer: >60 mL/min (ref >60)
GFR calc non Af Amer: >60 mL/min (ref >60)
GLUCOSE: 101 mg/dL — AB (ref 65–99)
POTASSIUM: 4 mmol/L (ref 3.5–5.1)
Sodium: 139 mmol/L (ref 135–145)

## 2015-03-08 LAB — URINE MICROSCOPIC-ADD ON: SQUAMOUS EPITHELIAL / LPF: NONE SEEN

## 2015-03-08 MED ORDER — ONDANSETRON HCL 4 MG PO TABS
4.0000 mg | ORAL_TABLET | Freq: Once | ORAL | Status: AC
  Administered 2015-03-08: 4 mg via ORAL
  Filled 2015-03-08: qty 1

## 2015-03-08 MED ORDER — HYDROCODONE-ACETAMINOPHEN 5-325 MG PO TABS: 1.0000 | ORAL_TABLET | ORAL | Status: DC | PRN

## 2015-03-08 MED ORDER — CEPHALEXIN 500 MG PO CAPS
500.0000 mg | ORAL_CAPSULE | Freq: Once | ORAL | Status: AC
  Administered 2015-03-08: 500 mg via ORAL
  Filled 2015-03-08: qty 1

## 2015-03-08 MED ORDER — CIPROFLOXACIN HCL 500 MG PO TABS: 500.0000 mg | ORAL_TABLET | Freq: Two times a day (BID) | ORAL | Status: DC

## 2015-03-08 MED ORDER — CIPROFLOXACIN HCL 250 MG PO TABS
500.0000 mg | ORAL_TABLET | Freq: Once | ORAL | Status: AC
  Administered 2015-03-08: 500 mg via ORAL
  Filled 2015-03-08: qty 2

## 2015-03-08 MED ORDER — HYDROCODONE-ACETAMINOPHEN 5-325 MG PO TABS
2.0000 | ORAL_TABLET | Freq: Once | ORAL | Status: AC
  Administered 2015-03-08: 2 via ORAL
  Filled 2015-03-08: qty 2

## 2015-03-08 NOTE — Discharge Instructions (Signed)
Your test today suggest a urinary tract infection. Please increase fluids. Please use Cipro 2 times daily with food. Please use Tylenol for fever or mild headache. May use Norco for more severe pain. Norco may cause drowsiness, please use with caution. Please see one of the members of the [facility] team as soon as possible concerning your urinary tract infection. Your prostate seems to be mild to moderately enlarged, please have this evaluated as well. Urinary Tract Infection A urinary tract infection (UTI) can occur any place along the urinary tract. The tract includes the kidneys, ureters, bladder, and urethra. A type of germ called bacteria often causes a UTI. UTIs are often helped with antibiotic medicine.  HOME CARE   If given, take antibiotics as told by your doctor. Finish them even if you start to feel better.  Drink enough fluids to keep your pee (urine) clear or pale yellow.  Avoid tea, drinks with caffeine, and bubbly (carbonated) drinks.  Pee often. Avoid holding your pee in for a long time.  Pee before and after having sex (intercourse).  Wipe from front to back after you poop (bowel movement) if you are a woman. Use each tissue only once. GET HELP RIGHT AWAY IF:   You have back pain.  You have lower belly (abdominal) pain.  You have chills.  You feel sick to your stomach (nauseous).  You throw up (vomit).  Your burning or discomfort with peeing does not go away.  You have a fever.  Your symptoms are not better in 3 days. MAKE SURE YOU:   Understand these instructions.  Will watch your condition.  Will get help right away if you are not doing well or get worse.   This information is not intended to replace advice given to you by your health care provider. Make sure you discuss any questions you have with your health care provider.   Document Released: 06/08/2007 Document Revised: 01/10/2014 Document Reviewed: 07/21/2011 Elsevier Interactive Patient  Education [employer].

## 2015-03-08 NOTE — ED Provider Notes (Signed)
CSN: [id number]     Arrival date & time 03/08/15  1437 History  By signing my name below, I, Albert Avila, attest that this documentation has been prepared under the direction and in the presence of Lily Kocher, PA-C. Electronically Signed: Stephania Avila, ED Scribe. 03/08/2015. 5:10 PM.   Chief Complaint  Patient presents with  . Headache  . Back Pain    witrh urinary symptoms   Patient is a 40 y.o. male presenting with headaches and back pain. The history is provided by the patient. No language interpreter was used.  Headache Pain location:  Frontal Quality:  Sharp Radiates to:  Does not radiate Severity currently:  8/10 Duration:  1 day Timing:  Constant Relieved by:  None tried Worsened by:  Nothing Ineffective treatments:  None tried Associated symptoms: back pain and fever (subjective)   Associated symptoms: no nausea and no vomiting   Back Pain Associated symptoms: dysuria, fever (subjective) and headaches     HPI Comments: Albert Avila is a 40 y.o. male who presents to the Emergency Department with multiple complaints, including a sharp, frontal headache and sharp, lower back pain that began about 3 days ago. He has noticed increased urination (he thinks he has as many as 10 visits to the bathroom last evening and on this early AM), as well as subjective fever and chills. Per nursing notes, patient's symptoms all onset at about the same time. No treatments or modifying factors were noted. He denies nausea or vomiting.     Past Medical History  Diagnosis Date  . GERD (gastroesophageal reflux disease)   . Helicobacter pylori gastritis     treated with triple therapy   Past Surgical History  Procedure Laterality Date  . Colonoscopy  08/05/2010    Procedure: COLONOSCOPY;  Surgeon: Dorothyann Peng, MD;  Location: [facility];  Service: Endoscopy;  Laterality: N/A;   Family History  Problem Relation Age of Onset  . Breast cancer Mother   . Cancer Father     ? GI-unsure    Social History  Substance Use Topics  . Smoking status: Former Smoker -- 0.50 packs/day for 10 years    Types: Cigarettes    Quit date: 02/25/2012  . Smokeless tobacco: Never Used  . Alcohol Use: 0.0 oz/week    0 Standard drinks or equivalent per week     Comment: 1 beer once every 2-4 weeks.    Review of Systems  Constitutional: Positive for fever (subjective) and chills.  Gastrointestinal: Negative for nausea and vomiting.  Genitourinary: Positive for dysuria and frequency.  Musculoskeletal: Positive for back pain.  Neurological: Positive for headaches.   Allergies  Review of patient's allergies indicates no known allergies.  Home Medications   Prior to Admission medications   Medication Sig Start Date End Date Taking? Authorizing Provider  omeprazole (PRILOSEC) 40 MG capsule Take 40 mg by mouth daily.     Yes Historical Provider, MD  polyethylene glycol-electrolytes (NULYTELY/GOLYTELY) 420 g solution Take 4,000 mLs by mouth once. 02/25/15   Carlis Stable, NP   BP 140/85 mmHg  Pulse 82  Temp(Src) 99.4 F (37.4 C) (Oral)  Resp 18  Ht 6\' 2"  (1.88 m)  Wt 250 lb (113.399 kg)  BMI 32.08 kg/m2  SpO2 100% Physical Exam  Constitutional: He is oriented to person, place, and time. He appears well-developed and well-nourished. No distress.  HENT:  Head: Normocephalic and atraumatic.  Eyes: Conjunctivae and EOM are normal.  Neck: Neck supple. No  tracheal deviation present.  Cardiovascular: Normal rate.   Pulmonary/Chest: Effort normal. No respiratory distress.  Abdominal:  **No*CVA tenderness.  Genitourinary: Penis normal. No penile erythema. No discharge found.  Good sphincter tone. Prostate mild-to-moderately enlarged, non-tender. Stool is negative for occult blood.   Musculoskeletal: Normal range of motion. He exhibits tenderness.  Pain to palpation and ROM of the lower lumbar area.   Neurological: He is alert and oriented to person, place, and time.  His gait is steady.  Balance is steady. Speech is clear. No motor or sensory deficits appreciated.   Skin: Skin is warm and dry.  Psychiatric: He has a normal mood and affect. His behavior is normal.  Nursing note and vitals reviewed.   ED Course  Procedures (including critical care time)  DIAGNOSTIC STUDIES: Oxygen Saturation is 100% on RA, normal by my interpretation.    COORDINATION OF CARE: 4:25 PM - Discussed treatment plan with pt at bedside which includes UA and pain medication. Pt verbalized understanding and agreed to plan.   Labs Review Labs Reviewed  URINALYSIS, ROUTINE W REFLEX MICROSCOPIC (NOT AT [facility]) - Abnormal; Notable for the following:    Hgb urine dipstick TRACE (*)    Leukocytes, UA SMALL (*)    All other components within normal limits  URINE MICROSCOPIC-ADD ON - Abnormal; Notable for the following:    Bacteria, UA FEW (*)    All other components within normal limits  URINE CULTURE  BASIC METABOLIC PANEL  CBC WITH DIFFERENTIAL/PLATELET  PSA    MDM  Patient is noted to have a low-grade temperature of 99.6. Vital signs are otherwise within normal limits. There is some enlargement of the prostate. And there is no urinary tract infection. The urine will be sent to the lab for culture. Patient will be treated with Cipro 2 times daily. Patient is referred to [facility] for additional evaluation concerning this urinary tract infection.    Final diagnoses:  UTI (lower urinary tract infection)    **I have reviewed nursing notes, vital signs, and all appropriate lab and imaging results for this patient.Lily Kocher, PA-C 03/10/15 [place], DO 03/11/15 [phone]

## 2015-03-08 NOTE — ED Notes (Signed)
Patient has multiple complaints. Patient c/o headache, fever, low back pain, and frequent urination. Per patient all symptoms started at the same time. Denies any nausea, vomiting, diarrhea. Denies any pain with urination but wife reports that patient had some pain with urination a month ago.

## 2015-03-11 LAB — URINE CULTURE

## 2015-03-12 ENCOUNTER — Telehealth (HOSPITAL_BASED_OUTPATIENT_CLINIC_OR_DEPARTMENT_OTHER): Payer: Self-pay | Admitting: Emergency Medicine

## 2015-03-12 NOTE — Telephone Encounter (Signed)
Post ED Visit - Positive Culture Follow-up  Culture report reviewed by antimicrobial stewardship pharmacist:  []  Elenor Quinones, Pharm.D. []  Heide Guile, Pharm.D., BCPS []  Parks Neptune, Pharm.D. []  Alycia Rossetti, Pharm.D., BCPS []  [place], Pharm.D., BCPS, AAHIVP []  Legrand [place], Pharm.D., BCPS, AAHIVP []  Milus Glazier, Pharm.D. []  Rob [place], [place].DGovernor Specking PharmD  Positive urine culture E. coli Treated with ciprofloxacin organism sensitive to the same and no further patient follow-up is required at this time.  Hazle Nordmann 03/12/2015, 10:48 AM

## 2015-03-19 ENCOUNTER — Telehealth: Payer: Self-pay | Admitting: Gastroenterology

## 2015-03-19 NOTE — Telephone Encounter (Signed)
Noted  

## 2015-03-19 NOTE — Telephone Encounter (Signed)
Pt wants to cancel his procedure for Monday. He said that his insurance would not cover it and he would have to pay $6000

## 2015-03-23 ENCOUNTER — Ambulatory Visit (HOSPITAL_COMMUNITY)
Admission: RE | Admit: 2015-03-23 | Payer: Commercial Managed Care - HMO | Source: Ambulatory Visit | Admitting: Gastroenterology

## 2015-03-23 ENCOUNTER — Encounter (HOSPITAL_COMMUNITY): Admission: RE | Payer: Self-pay | Source: Ambulatory Visit

## 2015-03-23 SURGERY — COLONOSCOPY
Anesthesia: Moderate Sedation

## 2015-04-29 ENCOUNTER — Ambulatory Visit: Payer: Commercial Managed Care - HMO | Admitting: Nurse Practitioner

## 2015-07-08 ENCOUNTER — Encounter: Payer: Self-pay | Admitting: Gastroenterology

## 2016-05-16 DIAGNOSIS — Z0000 Encounter for general adult medical examination without abnormal findings: Secondary | ICD-10-CM | POA: Diagnosis not present

## 2016-05-19 DIAGNOSIS — Z0000 Encounter for general adult medical examination without abnormal findings: Secondary | ICD-10-CM | POA: Diagnosis not present

## 2016-08-08 DIAGNOSIS — S46911A Strain of unspecified muscle, fascia and tendon at shoulder and upper arm level, right arm, initial encounter: Secondary | ICD-10-CM | POA: Diagnosis not present

## 2016-08-08 DIAGNOSIS — S46912A Strain of unspecified muscle, fascia and tendon at shoulder and upper arm level, left arm, initial encounter: Secondary | ICD-10-CM | POA: Diagnosis not present

## 2016-09-12 DIAGNOSIS — M25512 Pain in left shoulder: Secondary | ICD-10-CM | POA: Diagnosis not present

## 2016-09-12 DIAGNOSIS — M791 Myalgia: Secondary | ICD-10-CM | POA: Diagnosis not present

## 2016-09-12 DIAGNOSIS — X509XXD Other and unspecified overexertion or strenuous movements or postures, subsequent encounter: Secondary | ICD-10-CM | POA: Diagnosis not present

## 2016-09-12 DIAGNOSIS — M25511 Pain in right shoulder: Secondary | ICD-10-CM | POA: Diagnosis not present

## 2016-10-21 DIAGNOSIS — M546 Pain in thoracic spine: Secondary | ICD-10-CM | POA: Diagnosis not present

## 2016-10-21 DIAGNOSIS — M9902 Segmental and somatic dysfunction of thoracic region: Secondary | ICD-10-CM | POA: Diagnosis not present

## 2016-10-21 DIAGNOSIS — M9907 Segmental and somatic dysfunction of upper extremity: Secondary | ICD-10-CM | POA: Diagnosis not present

## 2016-10-26 DIAGNOSIS — M9907 Segmental and somatic dysfunction of upper extremity: Secondary | ICD-10-CM | POA: Diagnosis not present

## 2016-10-26 DIAGNOSIS — M546 Pain in thoracic spine: Secondary | ICD-10-CM | POA: Diagnosis not present

## 2016-10-26 DIAGNOSIS — M9902 Segmental and somatic dysfunction of thoracic region: Secondary | ICD-10-CM | POA: Diagnosis not present

## 2016-10-28 DIAGNOSIS — M9907 Segmental and somatic dysfunction of upper extremity: Secondary | ICD-10-CM | POA: Diagnosis not present

## 2016-10-28 DIAGNOSIS — M9902 Segmental and somatic dysfunction of thoracic region: Secondary | ICD-10-CM | POA: Diagnosis not present

## 2016-10-28 DIAGNOSIS — M546 Pain in thoracic spine: Secondary | ICD-10-CM | POA: Diagnosis not present

## 2016-11-02 DIAGNOSIS — M546 Pain in thoracic spine: Secondary | ICD-10-CM | POA: Diagnosis not present

## 2016-11-02 DIAGNOSIS — M9907 Segmental and somatic dysfunction of upper extremity: Secondary | ICD-10-CM | POA: Diagnosis not present

## 2016-11-02 DIAGNOSIS — M9902 Segmental and somatic dysfunction of thoracic region: Secondary | ICD-10-CM | POA: Diagnosis not present

## 2016-11-04 DIAGNOSIS — M546 Pain in thoracic spine: Secondary | ICD-10-CM | POA: Diagnosis not present

## 2016-11-04 DIAGNOSIS — M9902 Segmental and somatic dysfunction of thoracic region: Secondary | ICD-10-CM | POA: Diagnosis not present

## 2016-11-04 DIAGNOSIS — M9907 Segmental and somatic dysfunction of upper extremity: Secondary | ICD-10-CM | POA: Diagnosis not present

## 2016-11-11 DIAGNOSIS — M9902 Segmental and somatic dysfunction of thoracic region: Secondary | ICD-10-CM | POA: Diagnosis not present

## 2016-11-11 DIAGNOSIS — M546 Pain in thoracic spine: Secondary | ICD-10-CM | POA: Diagnosis not present

## 2016-11-11 DIAGNOSIS — M9907 Segmental and somatic dysfunction of upper extremity: Secondary | ICD-10-CM | POA: Diagnosis not present

## 2017-05-22 DIAGNOSIS — Z0000 Encounter for general adult medical examination without abnormal findings: Secondary | ICD-10-CM | POA: Diagnosis not present

## 2017-05-23 ENCOUNTER — Emergency Department (HOSPITAL_COMMUNITY)
Admission: EM | Admit: 2017-05-23 | Discharge: 2017-05-23 | Disposition: A | Discharge status: Home / Self Care | Payer: No Typology Code available for payment source | Attending: Emergency Medicine | Admitting: Emergency Medicine

## 2017-05-23 ENCOUNTER — Encounter (HOSPITAL_COMMUNITY): Payer: Self-pay | Admitting: Emergency Medicine

## 2017-05-23 ENCOUNTER — Emergency Department (HOSPITAL_COMMUNITY): Payer: No Typology Code available for payment source

## 2017-05-23 ENCOUNTER — Other Ambulatory Visit: Payer: Self-pay

## 2017-05-23 DIAGNOSIS — Y929 Unspecified place or not applicable: Secondary | ICD-10-CM | POA: Diagnosis not present

## 2017-05-23 DIAGNOSIS — S39012A Strain of muscle, fascia and tendon of lower back, initial encounter: Secondary | ICD-10-CM | POA: Insufficient documentation

## 2017-05-23 DIAGNOSIS — Z87891 Personal history of nicotine dependence: Secondary | ICD-10-CM | POA: Diagnosis not present

## 2017-05-23 DIAGNOSIS — S161XXA Strain of muscle, fascia and tendon at neck level, initial encounter: Secondary | ICD-10-CM | POA: Insufficient documentation

## 2017-05-23 DIAGNOSIS — S8391XA Sprain of unspecified site of right knee, initial encounter: Secondary | ICD-10-CM | POA: Insufficient documentation

## 2017-05-23 DIAGNOSIS — S199XXA Unspecified injury of neck, initial encounter: Secondary | ICD-10-CM | POA: Diagnosis present

## 2017-05-23 DIAGNOSIS — Z79899 Other long term (current) drug therapy: Secondary | ICD-10-CM | POA: Insufficient documentation

## 2017-05-23 DIAGNOSIS — M542 Cervicalgia: Secondary | ICD-10-CM | POA: Diagnosis not present

## 2017-05-23 DIAGNOSIS — Y999 Unspecified external cause status: Secondary | ICD-10-CM | POA: Insufficient documentation

## 2017-05-23 DIAGNOSIS — Y939 Activity, unspecified: Secondary | ICD-10-CM | POA: Insufficient documentation

## 2017-05-23 MED ORDER — IBUPROFEN 800 MG PO TABS: 800.0000 mg | ORAL_TABLET | Freq: Three times a day (TID) | ORAL | 0 refills | Status: DC

## 2017-05-23 MED ORDER — CYCLOBENZAPRINE HCL 10 MG PO TABS
10.0000 mg | ORAL_TABLET | Freq: Once | ORAL | Status: AC
  Administered 2017-05-23: 10 mg via ORAL
  Filled 2017-05-23: qty 1

## 2017-05-23 MED ORDER — HYDROCODONE-ACETAMINOPHEN 5-325 MG PO TABS: ORAL_TABLET | ORAL | 0 refills | Status: DC

## 2017-05-23 MED ORDER — IBUPROFEN 800 MG PO TABS
800.0000 mg | ORAL_TABLET | Freq: Once | ORAL | Status: AC
  Administered 2017-05-23: 800 mg via ORAL
  Filled 2017-05-23: qty 1

## 2017-05-23 MED ORDER — CYCLOBENZAPRINE HCL 10 MG PO TABS: 10.0000 mg | ORAL_TABLET | Freq: Three times a day (TID) | ORAL | 0 refills | Status: DC | PRN
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
## 2017-05-23 NOTE — Discharge Instructions (Addendum)
Apply ice packs on and off to your back and neck.  Wear the knee sleeve as needed for support when weightbearing you may remove it for bathing and at bedtime.  Do not wear it continuously.  Follow-up with your primary doctor for recheck in 1 week if not improving.  Return to the ER for any worsening symptoms.

## 2017-05-23 NOTE — ED Notes (Signed)
Patient to Radiology
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
## 2017-05-26 DIAGNOSIS — Z6834 Body mass index (BMI) 34.0-34.9, adult: Secondary | ICD-10-CM | POA: Diagnosis not present

## 2017-05-26 DIAGNOSIS — Z0001 Encounter for general adult medical examination with abnormal findings: Secondary | ICD-10-CM | POA: Diagnosis not present

## 2017-06-19 ENCOUNTER — Encounter: Payer: Self-pay | Admitting: Gastroenterology

## 2017-06-22 ENCOUNTER — Ambulatory Visit: Payer: 59 | Admitting: Gastroenterology

## 2017-09-08 DIAGNOSIS — M542 Cervicalgia: Secondary | ICD-10-CM | POA: Diagnosis not present

## 2017-09-08 DIAGNOSIS — M5126 Other intervertebral disc displacement, lumbar region: Secondary | ICD-10-CM | POA: Diagnosis not present

## 2017-09-11 DIAGNOSIS — M545 Low back pain: Secondary | ICD-10-CM | POA: Diagnosis not present

## 2017-09-15 ENCOUNTER — Ambulatory Visit: Payer: 59 | Admitting: Gastroenterology

## 2017-09-26 DIAGNOSIS — G8929 Other chronic pain: Secondary | ICD-10-CM | POA: Diagnosis not present

## 2017-09-26 DIAGNOSIS — M5441 Lumbago with sciatica, right side: Secondary | ICD-10-CM | POA: Diagnosis not present

## 2017-09-26 DIAGNOSIS — M542 Cervicalgia: Secondary | ICD-10-CM | POA: Diagnosis not present

## 2017-09-28 ENCOUNTER — Ambulatory Visit: Payer: 59 | Admitting: Gastroenterology

## 2017-09-28 ENCOUNTER — Encounter: Payer: Self-pay | Admitting: Gastroenterology

## 2017-09-28 VITALS — BP 148/87 | HR 79 | Temp 97.2°F | Ht 74.0 in | Wt 264.0 lb

## 2017-09-28 DIAGNOSIS — K625 Hemorrhage of anus and rectum: Secondary | ICD-10-CM | POA: Diagnosis not present

## 2017-09-28 DIAGNOSIS — D649 Anemia, unspecified: Secondary | ICD-10-CM | POA: Diagnosis not present

## 2017-09-28 NOTE — Patient Instructions (Signed)
  AVOID ASPIRIN, BC/GOODY POWDERS, IBUPROFEN/MOTRIN, OR NAPROXEN/ALEVE. UNLESS MEDICALLY NECESSARY.  COMPLETE LABS within the WEEK.   YOUR RESULTS WILL BE BACK IN 5 BUSINESS DAYS.  FOLLOW UP IN 6 MOS.

## 2017-09-28 NOTE — Progress Notes (Signed)
ON RECALL  °

## 2017-09-28 NOTE — Progress Notes (Signed)
CC'D TO PCP °

## 2017-09-28 NOTE — Assessment & Plan Note (Signed)
IN THE SETTING OF LOW VOLUME HEMATOCHEZIA AND NSAID USE. LAST TCS 2012. NO EGD.  AVOID ASPIRIN, BC/GOODY POWDERS, IBUPROFEN/MOTRIN, OR NAPROXEN/ALEVE. UNLESS MEDICALLY NECESSARY. COMPLETE LABS within the WEEK.  RESULTS WILL BE BACK IN 5 BUSINESS DAYS.  FOLLOW UP IN 6 MOS.

## 2017-09-28 NOTE — Assessment & Plan Note (Signed)
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  AVOID ASPIRIN, BC/GOODY POWDERS, IBUPROFEN/MOTRIN, OR NAPROXEN/ALEVE UNLESS MEDICALLY NECESSARY. COMPLETE LABS: CBCB/FERRITIN within the WEEK.  RESULTS WILL BE BACK IN 5 BUSINESS DAYS.  FOLLOW UP IN 6 MOS.

## 2017-09-28 NOTE — Progress Notes (Signed)
   Subjective:    Patient ID: Albert Avila, male    DOB: December 12, 1975, 42 y.o.   MRN: [id number]  Curlene Labrum, MD  HPI PT HAD TCS 2012. RARE HEARTBURN. HEMORRHOID ISSUES AND CONSTIPATION AND IF HE WIPES HE MAY SEE A LITTLE BLOOD. WHAT HE EATS MAY THOR HIM OFF. DRINKS WATER. DOESN'T EAT A LOT OF FIBER. NO ASPIRIN, BC/GOODY POWDERS, IBUPROFEN/MOTRIN(LAST TIME: MAY/JUN), OR NAPROXEN/ALEVE.   PT DENIES FEVER, CHILLS, HEMATEMESIS, nausea, vomiting, melena, diarrhea, CHEST PAIN, SHORTNESS OF BREATH, CHANGE IN BOWEL IN HABITS, constipation, abdominal pain, problems swallowing, problems with sedation, OR heartburn or indigestion.  Past Medical History:  Diagnosis Date  . GERD (gastroesophageal reflux disease)   . Helicobacter pylori gastritis    treated with triple therapy    Past Surgical History:  Procedure Laterality Date  . COLONOSCOPY  08/05/2010   FWY:OVZCHY in the rectum and sigmoid colon/diverticulosis in the sigmoid colon/internal hemorrhoids   No Known Allergies  Current Outpatient Medications  Medication Sig    . cyclobenzaprine (FLEXERIL) 10 MG tablet Take 1 tablet (10 mg total) by mouth 3 (three) times daily as needed.    Marland Kitchen HYDROcodone-acetaminophen (NORCO/VICODIN) 5-325 MG tablet Take one tab po q 4 hrs prn pain    .      .      . omeprazole (PRILOSEC) 40 MG capsule Take 40 mg by mouth daily.      .       Review of Systems PER HPI OTHERWISE ALL SYSTEMS ARE NEGATIVE.    Objective:   Physical Exam  Constitutional: He is oriented to person, place, and time. He appears well-developed and well-nourished. No distress.  HENT:  Head: Normocephalic and atraumatic.  Mouth/Throat: Oropharynx is clear and moist. No oropharyngeal exudate.  Eyes: Pupils are equal, round, and reactive to light. No scleral icterus.  Neck: Normal range of motion. Neck supple.  Cardiovascular: Normal rate, regular rhythm and normal heart sounds.  Pulmonary/Chest: Effort normal and breath sounds  normal. No respiratory distress.  Abdominal: Soft. Bowel sounds are normal. He exhibits no distension. There is no tenderness.  Musculoskeletal: He exhibits no edema.  Lymphadenopathy:    He has no cervical adenopathy.  Neurological: He is alert and oriented to person, place, and time.  NO FOCAL DEFICITS  Psychiatric: He has a normal mood and affect.  Vitals reviewed.     Assessment & Plan:

## 2017-09-29 DIAGNOSIS — D649 Anemia, unspecified: Secondary | ICD-10-CM | POA: Diagnosis not present

## 2017-09-29 LAB — CBC WITH DIFFERENTIAL/PLATELET
Basophils Absolute: 29 cells/uL (ref 0–200)
Basophils Relative: 0.3 %
EOS PCT: 0.6 %
Eosinophils Absolute: 57 cells/uL (ref 15–500)
HCT: 36 % — ABNORMAL LOW (ref 38.5–50.0)
Hemoglobin: 12 g/dL — ABNORMAL LOW (ref 13.2–17.1)
Lymphs Abs: 2632 cells/uL (ref 850–3900)
MCH: 30.6 pg (ref 27.0–33.0)
MCHC: 33.3 g/dL (ref 32.0–36.0)
MCV: 91.8 fL (ref 80.0–100.0)
MONOS PCT: 8.9 %
MPV: 10.6 fL (ref 7.5–12.5)
NEUTROS ABS: 5938 {cells}/uL (ref 1500–7800)
NEUTROS PCT: 62.5 %
PLATELETS: 271 10*3/uL (ref 140–400)
RBC: 3.92 10*6/uL — ABNORMAL LOW (ref 4.20–5.80)
RDW: 12.3 % (ref 11.0–15.0)
Total Lymphocyte: 27.7 %
WBC mixed population: 846 cells/uL (ref 200–950)
WBC: 9.5 10*3/uL (ref 3.8–10.8)

## 2017-09-29 LAB — FERRITIN: FERRITIN: 448 ng/mL — AB (ref 38–380)

## 2017-10-05 ENCOUNTER — Other Ambulatory Visit: Payer: Self-pay

## 2017-10-05 ENCOUNTER — Telehealth: Payer: Self-pay

## 2017-10-05 DIAGNOSIS — D649 Anemia, unspecified: Secondary | ICD-10-CM

## 2017-10-05 DIAGNOSIS — K625 Hemorrhage of anus and rectum: Secondary | ICD-10-CM

## 2017-10-05 MED ORDER — PEG 3350-KCL-NA BICARB-NACL 420 G PO SOLR: 4000.0000 mL | ORAL | 0 refills | Status: DC

## 2017-10-05 NOTE — Telephone Encounter (Signed)
PT called back and said it is OK to schedule his TCS and EGD.  Please see note/ order under labs.

## 2017-10-05 NOTE — Telephone Encounter (Signed)
Procedure scheduled (see lab result note).

## 2017-10-05 NOTE — Progress Notes (Signed)
PT is aware. He wants to check with his insurance and will call back if he decides to schedule.

## 2017-10-09 NOTE — Progress Notes (Signed)
CC'D TO PCP °

## 2017-10-24 ENCOUNTER — Telehealth: Payer: Self-pay

## 2017-10-24 DIAGNOSIS — G8929 Other chronic pain: Secondary | ICD-10-CM | POA: Diagnosis not present

## 2017-10-24 DIAGNOSIS — M5441 Lumbago with sciatica, right side: Secondary | ICD-10-CM | POA: Diagnosis not present

## 2017-10-24 DIAGNOSIS — M542 Cervicalgia: Secondary | ICD-10-CM | POA: Diagnosis not present

## 2017-10-24 NOTE — Telephone Encounter (Signed)
Pt called saying that [facility] didn't have his RX for his procedure. I spoke with Caryl Pina at [facility] and they do have the RX but will order the day pt wants to fill it and he can pick it up the following day. Pt was asked to call the Pharmacy when he's ready for them to order RX.

## 2017-11-03 DIAGNOSIS — M542 Cervicalgia: Secondary | ICD-10-CM | POA: Diagnosis not present

## 2017-11-03 DIAGNOSIS — M5126 Other intervertebral disc displacement, lumbar region: Secondary | ICD-10-CM | POA: Diagnosis not present

## 2017-11-21 DIAGNOSIS — G8929 Other chronic pain: Secondary | ICD-10-CM | POA: Diagnosis not present

## 2017-11-21 DIAGNOSIS — M542 Cervicalgia: Secondary | ICD-10-CM | POA: Diagnosis not present

## 2017-11-21 DIAGNOSIS — M5441 Lumbago with sciatica, right side: Secondary | ICD-10-CM | POA: Diagnosis not present

## 2017-12-18 ENCOUNTER — Telehealth: Payer: Self-pay

## 2017-12-18 ENCOUNTER — Other Ambulatory Visit: Payer: Self-pay

## 2017-12-18 ENCOUNTER — Encounter (HOSPITAL_COMMUNITY): Payer: Self-pay | Admitting: Gastroenterology

## 2017-12-18 ENCOUNTER — Encounter (HOSPITAL_COMMUNITY): Admission: RE | Payer: Self-pay | Source: Home / Self Care

## 2017-12-18 ENCOUNTER — Ambulatory Visit (HOSPITAL_COMMUNITY): Admission: RE | Admit: 2017-12-18 | Payer: 59 | Source: Home / Self Care | Admitting: Gastroenterology

## 2017-12-18 DIAGNOSIS — K625 Hemorrhage of anus and rectum: Secondary | ICD-10-CM

## 2017-12-18 DIAGNOSIS — D649 Anemia, unspecified: Secondary | ICD-10-CM

## 2017-12-18 SURGERY — COLONOSCOPY
Anesthesia: Moderate Sedation

## 2017-12-18 NOTE — Telephone Encounter (Signed)
Pt called office and LMOVM x2. Scheduled for TCS-/+EGD w/SLF today. He requests call back.  He checked with his insurance company and he will have to pay $3000 unless it's coded as screening. Dx for procedure is normocytic anemia, rectal bleeding. Tried to call pt, no answer, LMOVM for return call.

## 2017-12-18 NOTE — Telephone Encounter (Signed)
Pt called office, he didn't go for his procedure today. He received call 12/15/17 from pre-service center. Pre-service center told him he would have to bring approx $3100 with him the day of his procedure. Pt stated he was unable to take that much money with him to the hospital. I told pt he can make payments. He said they told me I would have to have the money the day of the procedure. Advised him I would speak to manager and call him back. He would like to reschedule the procedure if he can make payments.  Spoke to Glass blower/designer, she advised he can make payments for procedure.

## 2017-12-18 NOTE — OR Nursing (Signed)
Attempted to call patient on Friday and this am at 1000. Unable to reach patient. Patient was to arrive at 1100 for procedure and did not show up. Zeb Comfort at the office notified.

## 2017-12-18 NOTE — Telephone Encounter (Signed)
Called pt, advised him he can make payments for procedure. Procedure rescheduled to 03/12/18 at 12:00pm. 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 New instructions mailed. He has prep. New instructions mailed.

## 2018-01-15 ENCOUNTER — Encounter (HOSPITAL_COMMUNITY): Payer: Self-pay | Admitting: Emergency Medicine

## 2018-01-15 ENCOUNTER — Other Ambulatory Visit: Payer: Self-pay

## 2018-01-15 DIAGNOSIS — K5901 Slow transit constipation: Secondary | ICD-10-CM | POA: Diagnosis not present

## 2018-01-15 DIAGNOSIS — K5900 Constipation, unspecified: Secondary | ICD-10-CM | POA: Diagnosis not present

## 2018-01-15 DIAGNOSIS — Z87891 Personal history of nicotine dependence: Secondary | ICD-10-CM | POA: Diagnosis not present

## 2018-01-15 LAB — CBC
HCT: 39.3 % (ref 39.0–52.0)
HEMOGLOBIN: 12.5 g/dL — AB (ref 13.0–17.0)
MCH: 30 pg (ref 26.0–34.0)
MCHC: 31.8 g/dL (ref 30.0–36.0)
MCV: 94.2 fL (ref 80.0–100.0)
Platelets: 185 10*3/uL (ref 150–400)
RBC: 4.17 MIL/uL — ABNORMAL LOW (ref 4.22–5.81)
RDW: 13.2 % (ref 11.5–15.5)
WBC: 8.4 10*3/uL (ref 4.0–10.5)
nRBC: 0 % (ref 0.0–0.2)

## 2018-01-15 LAB — COMPREHENSIVE METABOLIC PANEL
ALT: 35 U/L (ref 0–44)
AST: 31 U/L (ref 15–41)
Albumin: 4.3 g/dL (ref 3.5–5.0)
Alkaline Phosphatase: 61 U/L (ref 38–126)
Anion gap: 8 (ref 5–15)
BUN: 16 mg/dL (ref 6–20)
CO2: 26 mmol/L (ref 22–32)
Calcium: 9.1 mg/dL (ref 8.9–10.3)
Chloride: 103 mmol/L (ref 98–111)
Creatinine, Ser: 1.02 mg/dL (ref 0.61–1.24)
GFR calc Af Amer: >60 mL/min (ref >60)
GFR calc non Af Amer: >60 mL/min (ref >60)
Glucose, Bld: 105 mg/dL — ABNORMAL HIGH (ref 70–99)
Potassium: 4.3 mmol/L (ref 3.5–5.1)
Sodium: 137 mmol/L (ref 135–145)
Total Bilirubin: 0.7 mg/dL (ref 0.3–1.2)
Total Protein: 8 g/dL (ref 6.5–8.1)

## 2018-01-15 LAB — URINALYSIS, ROUTINE W REFLEX MICROSCOPIC
Bilirubin Urine: NEGATIVE
Glucose, UA: NEGATIVE mg/dL
Hgb urine dipstick: NEGATIVE
Ketones, ur: NEGATIVE mg/dL
Leukocytes, UA: NEGATIVE
Nitrite: NEGATIVE
Protein, ur: NEGATIVE mg/dL
SPECIFIC GRAVITY, URINE: 1.013 (ref 1.005–1.030)
pH: 6 (ref 5.0–8.0)

## 2018-01-15 LAB — LIPASE, BLOOD: Lipase: 28 U/L (ref 11–51)

## 2018-01-15 NOTE — ED Triage Notes (Signed)
Pt C/O constipation and trouble urinating since Saturday. Pt states he has taken dulcolax and miralax and has had some results on Saturday.

## 2018-01-16 ENCOUNTER — Emergency Department (HOSPITAL_COMMUNITY): Payer: 59

## 2018-01-16 ENCOUNTER — Emergency Department (HOSPITAL_COMMUNITY)
Admission: EM | Admit: 2018-01-16 | Discharge: 2018-01-16 | Disposition: A | Discharge status: Home / Self Care | Payer: 59 | Attending: Emergency Medicine | Admitting: Emergency Medicine

## 2018-01-16 DIAGNOSIS — K5901 Slow transit constipation: Secondary | ICD-10-CM

## 2018-01-16 DIAGNOSIS — K5900 Constipation, unspecified: Secondary | ICD-10-CM | POA: Diagnosis not present

## 2018-01-16 NOTE — ED Notes (Signed)
Bladder Scan Performed. 418ml residual urine measured. Pt stated he would like to try to use the restroom and urinate. RN will re-measure bladder scan post-void upon Pts return to room.

## 2018-01-16 NOTE — ED Provider Notes (Signed)
[facility] EMERGENCY DEPARTMENT Provider Note   CSN: [id number] Arrival date & time: 01/15/18  2242     History   Chief Complaint Chief Complaint  Patient presents with  . Constipation    HPI Albert Avila is a 43 y.o. male.  The history is provided by the patient.  Constipation  Severity:  Moderate Time since last bowel movement:  3 days Timing:  Constant Progression:  Worsening Chronicity:  New Relieved by:  Nothing Worsened by:  Nothing Associated symptoms: no diarrhea, no hematochezia and no vomiting   Patient reports he has not had a bowel movement in 3 days.  No fevers or vomiting.  No significant abdominal pain, but he does have pressure around his rectum.  He also reports difficulty urinating and pressure around his bladder. He is tried MiraLAX and Dulcolax without improvement.    Past Medical History:  Diagnosis Date  . GERD (gastroesophageal reflux disease)   . Helicobacter pylori gastritis    treated with triple therapy    Patient Active Problem List   Diagnosis Date Noted  . Bowel habit changes 02/25/2015  . Abdominal pain 02/25/2015  . Normocytic anemia 11/04/2010  . Chronic diarrhea 07/22/2010  . GERD (gastroesophageal reflux disease) 07/22/2010  . Rectal bleeding 07/22/2010    Past Surgical History:  Procedure Laterality Date  . COLONOSCOPY  08/05/2010   DVV:OHYWVP in the rectum and sigmoid colon/diverticulosis in the sigmoid colon/internal hemorrhoids        Home Medications    Prior to Admission medications   Medication Sig Start Date End Date Taking? Authorizing Provider  polyethylene glycol-electrolytes (TRILYTE) 420 g solution Take 4,000 mLs by mouth as directed. 10/05/17   Fields, Marga Melnick, MD  pseudoephedrine (SUDAFED) 30 MG tablet Take 60 mg by mouth every 6 (six) hours as needed for congestion.    [provider]    Family History Family History  Problem Relation Age of Onset  . Cancer Father        ? GI-unsure  .  Breast cancer Mother     Social History Social History   Tobacco Use  . Smoking status: Former Smoker    Packs/day: 0.50    Years: 10.00    Pack years: 5.00    Types: Cigarettes    Last attempt to quit: 02/25/2012    Years since quitting: 5.8  . Smokeless tobacco: Never Used  Substance Use Topics  . Alcohol use: Yes    Alcohol/week: 0.0 standard drinks    Comment: 6 pack/weekend  . Drug use: No     Allergies   Patient has no known allergies.   Review of Systems Review of Systems  Gastrointestinal: Positive for constipation. Negative for blood in stool, diarrhea, hematochezia and vomiting.  Genitourinary: Positive for difficulty urinating.  All other systems reviewed and are negative.    Physical Exam Updated Vital Signs BP (!) 135/100 (BP Location: Right Arm)   Pulse 87   Temp 97.7 F (36.5 C) (Temporal)   Resp 14   Ht 1.88 m (6\' 2" )   Wt 120.2 kg   SpO2 96%   BMI 34.02 kg/m   Physical Exam CONSTITUTIONAL: Well developed/well nourished HEAD: Normocephalic/atraumatic EYES: EOMI/PERRL ENMT: Mucous membranes moist NECK: supple no meningeal signs SPINE/BACK:entire spine nontender CV: S1/S2 noted, no murmurs/rubs/gallops noted LUNGS: Lungs are clear to auscultation bilaterally, no apparent distress ABDOMEN: soft, nontender, no rebound or guarding, bowel sounds noted throughout abdomen Rectal-no mass, no abscess, no blood, no melena, soft  brown stool noted, prostate does appear enlarged.  Nurse chaperone present GU:no cva tenderness NEURO: Pt is awake/alert/appropriate, moves all extremitiesx4.  No facial droop.   EXTREMITIES: pulses normal/equal, full ROM SKIN: warm, color normal PSYCH: no abnormalities of mood noted, alert and oriented to situation  ED Treatments / Results  Labs (all labs ordered are listed, but only abnormal results are displayed) Labs Reviewed  COMPREHENSIVE METABOLIC PANEL - Abnormal; Notable for the following components:       Result Value   Glucose, Bld 105 (*)    All other components within normal limits  CBC - Abnormal; Notable for the following components:   RBC 4.17 (*)    Hemoglobin 12.5 (*)    All other components within normal limits  LIPASE, BLOOD  URINALYSIS, ROUTINE W REFLEX MICROSCOPIC    EKG None  Radiology Dg Acute Abd 2+v Abd (supine,erect,decub) + 1v Chest  Result Date: 01/16/2018 CLINICAL DATA:  Constipation EXAM: DG ABDOMEN ACUTE W/ 1V CHEST COMPARISON:  None. FINDINGS: Lungs are clear. Mild left basilar scarring/atelectasis. No pleural effusion or pneumothorax. The heart is normal in size. Nonobstructive bowel gas pattern. No evidence of free air under the diaphragm on the upright view. Normal colonic stool burden. Visualized osseous structures are within normal limits. IMPRESSION: No evidence of acute cardiopulmonary disease. No evidence of small bowel obstruction or free air. Electronically Signed   By: Julian Hy M.D.   On: 01/16/2018 02:33    Procedures Procedures    Medications Ordered in ED Medications - No data to display   Initial Impression / Assessment and Plan / ED Course  I have reviewed the triage vital signs and the nursing notes.  Pertinent labs & imaging results that were available during my care of the patient were reviewed by me and considered in my medical decision making (see chart for details).     Patient was able to urinate without any significant urinary retention. Xray is negative for acute obstructive process.  His abdominal exam is unremarkable.   Patient improved.  No acute distress.  No vomiting.  He will try MiraLAX for next 2 to 3 days.  We discussed strict ER return precautions Final Clinical Impressions(s) / ED Diagnoses   Final diagnoses:  Slow transit constipation    ED Discharge Orders    None       Ripley Fraise, MD 01/16/18 0400

## 2018-01-16 NOTE — ED Notes (Signed)
Bladder Scan Performed Post-Void. 39ml Residual Urine measured. MD Notified.

## 2018-01-25 ENCOUNTER — Encounter: Payer: Self-pay | Admitting: Gastroenterology

## 2018-02-22 DIAGNOSIS — J069 Acute upper respiratory infection, unspecified: Secondary | ICD-10-CM | POA: Diagnosis not present

## 2018-03-06 ENCOUNTER — Telehealth: Payer: Self-pay | Admitting: Gastroenterology

## 2018-03-06 NOTE — Telephone Encounter (Signed)
Pt is scheduled colonoscopy for Monday and is having painful stools. Please Arnoldo Hooker [phone]

## 2018-03-06 NOTE — Telephone Encounter (Signed)
Pt said he is having regular BM's but it is painful to get out and sometimes he has a little and then he will have the urge to have more.   He said he has hemorrhoids and he feels that it most of his problem. It is just painful to have a BM and has a little tinge of pink of paper after a BM.  Dr. Oneida Alar, please advise!

## 2018-03-07 NOTE — Telephone Encounter (Signed)
PLEASE CALL PT. IF HE IS HAVING A HEMORRHOID FLARE HE SHOULD  USE PREPARATION H UP 4 TIMES A DAY FOR 7 DAYS THEN AS NEEDED TO CONTROL RECTAL BLEEDING, ITCHING, PRESSURE, BURNING, OR PAIN.   IF HIS HEMORRHOID PAIN IS NOT IMPROVED WE CAN REFER HIM T A SURGEON FOR A HEMORRHOIDECTOMY.

## 2018-03-07 NOTE — Telephone Encounter (Signed)
Pt notified of SF recommendations and will call back if needed.

## 2018-03-12 ENCOUNTER — Other Ambulatory Visit: Payer: Self-pay

## 2018-03-12 ENCOUNTER — Encounter (HOSPITAL_COMMUNITY)
Admission: RE | Disposition: A | Discharge status: Home / Self Care | Payer: Self-pay | Source: Home / Self Care | Attending: Gastroenterology

## 2018-03-12 ENCOUNTER — Encounter (HOSPITAL_COMMUNITY): Payer: Self-pay

## 2018-03-12 ENCOUNTER — Ambulatory Visit (HOSPITAL_COMMUNITY)
Admission: RE | Admit: 2018-03-12 | Discharge: 2018-03-12 | Disposition: A | Discharge status: Home / Self Care | Payer: 59 | Attending: Gastroenterology | Admitting: Gastroenterology

## 2018-03-12 DIAGNOSIS — K648 Other hemorrhoids: Secondary | ICD-10-CM | POA: Diagnosis not present

## 2018-03-12 DIAGNOSIS — D123 Benign neoplasm of transverse colon: Secondary | ICD-10-CM | POA: Diagnosis not present

## 2018-03-12 DIAGNOSIS — K210 Gastro-esophageal reflux disease with esophagitis: Secondary | ICD-10-CM | POA: Diagnosis not present

## 2018-03-12 DIAGNOSIS — K921 Melena: Secondary | ICD-10-CM | POA: Diagnosis not present

## 2018-03-12 DIAGNOSIS — K2950 Unspecified chronic gastritis without bleeding: Secondary | ICD-10-CM | POA: Insufficient documentation

## 2018-03-12 DIAGNOSIS — K219 Gastro-esophageal reflux disease without esophagitis: Secondary | ICD-10-CM | POA: Insufficient documentation

## 2018-03-12 DIAGNOSIS — Z87891 Personal history of nicotine dependence: Secondary | ICD-10-CM | POA: Diagnosis not present

## 2018-03-12 DIAGNOSIS — K2970 Gastritis, unspecified, without bleeding: Secondary | ICD-10-CM

## 2018-03-12 DIAGNOSIS — Q438 Other specified congenital malformations of intestine: Secondary | ICD-10-CM | POA: Insufficient documentation

## 2018-03-12 DIAGNOSIS — K625 Hemorrhage of anus and rectum: Secondary | ICD-10-CM | POA: Diagnosis not present

## 2018-03-12 DIAGNOSIS — D649 Anemia, unspecified: Secondary | ICD-10-CM

## 2018-03-12 DIAGNOSIS — B9681 Helicobacter pylori [H. pylori] as the cause of diseases classified elsewhere: Secondary | ICD-10-CM

## 2018-03-12 DIAGNOSIS — K621 Rectal polyp: Secondary | ICD-10-CM | POA: Diagnosis not present

## 2018-03-12 DIAGNOSIS — Z86010 Personal history of colonic polyps: Secondary | ICD-10-CM

## 2018-03-12 DIAGNOSIS — K5730 Diverticulosis of large intestine without perforation or abscess without bleeding: Secondary | ICD-10-CM | POA: Insufficient documentation

## 2018-03-12 HISTORY — PX: COLONOSCOPY: SHX5424

## 2018-03-12 HISTORY — PX: BIOPSY: SHX5522

## 2018-03-12 HISTORY — PX: POLYPECTOMY: SHX5525

## 2018-03-12 HISTORY — PX: ESOPHAGOGASTRODUODENOSCOPY: SHX5428

## 2018-03-12 SURGERY — COLONOSCOPY
Anesthesia: Moderate Sedation

## 2018-03-12 MED ORDER — MIDAZOLAM HCL 5 MG/5ML IJ SOLN
INTRAMUSCULAR | Status: AC
  Filled 2018-03-12: qty 10

## 2018-03-12 MED ORDER — OMEPRAZOLE 20 MG PO CPDR: DELAYED_RELEASE_CAPSULE | ORAL | 3 refills | Status: DC

## 2018-03-12 MED ORDER — MIDAZOLAM HCL 5 MG/5ML IJ SOLN
INTRAMUSCULAR | Status: DC | PRN
  Administered 2018-03-12 (×4): 2 mg via INTRAVENOUS

## 2018-03-12 MED ORDER — STERILE WATER FOR IRRIGATION IR SOLN
Status: DC | PRN
  Administered 2018-03-12: 1.5 mL

## 2018-03-12 MED ORDER — LIDOCAINE VISCOUS HCL 2 % MT SOLN
OROMUCOSAL | Status: DC | PRN
  Administered 2018-03-12: 1 via OROMUCOSAL

## 2018-03-12 MED ORDER — MEPERIDINE HCL 100 MG/ML IJ SOLN
INTRAMUSCULAR | Status: DC | PRN
  Administered 2018-03-12: 50 mg via INTRAVENOUS
  Administered 2018-03-12: 25 mg via INTRAVENOUS

## 2018-03-12 MED ORDER — LIDOCAINE VISCOUS HCL 2 % MT SOLN
OROMUCOSAL | Status: AC
  Filled 2018-03-12: qty 15

## 2018-03-12 MED ORDER — MEPERIDINE HCL 100 MG/ML IJ SOLN
INTRAMUSCULAR | Status: AC
  Filled 2018-03-12: qty 2

## 2018-03-12 NOTE — Op Note (Signed)
[facility] Patient Name: Albert Avila Procedure Date: 03/12/2018 11:26 AM MRN: [id number] Date of Birth: [birth year] Attending MD: Barney Drain MD, MD CSN: [id number] Age: 43 Admit Type: Outpatient Procedure:                ColonoscopyWITH COLD SNARE POLYPECTOMY Indications:              Hematochezia, Anemia, Rectal pain Providers:                Barney Drain MD, MD, Janeece Riggers, RN, Randa Spike, Technician, Raphael Gibney, Technician Referring MD:             Curlene Labrum Medicines:                Meperidine 75 mg IV, Midazolam 6 mg IV Complications:            No immediate complications. Estimated Blood Loss:     Estimated blood loss was minimal. Procedure:                Pre-Anesthesia Assessment:                           - Prior to the procedure, a History and Physical                            was performed, and patient medications and                            allergies were reviewed. The patient's tolerance of                            previous anesthesia was also reviewed. The risks                            and benefits of the procedure and the sedation                            options and risks were discussed with the patient.                            All questions were answered, and informed consent                            was obtained. Prior Anticoagulants: The patient has                            taken no previous anticoagulant or antiplatelet                            agents. ASA Grade Assessment: I - A normal, healthy                            patient. After reviewing the risks and benefits,  the patient was deemed in satisfactory condition to                            undergo the procedure. After obtaining informed                            consent, the colonoscope was passed under direct                            vision. Throughout the procedure, the patient's   blood pressure, pulse, and oxygen saturations were                            monitored continuously. The CF-HQ190L ([id number])                            scope was introduced through the anus and advanced                            to the the terminal ileum. The colonoscopy was                            somewhat difficult due to a tortuous colon.                            Successful completion of the procedure was aided by                            straightening and shortening the scope to obtain                            bowel loop reduction and COLOWRAP. The patient                            tolerated the procedure well. The quality of the                            bowel preparation was excellent. The terminal                            ileum, ileocecal valve, appendiceal orifice, and                            rectum were photographed. Scope In: 12:17:34 PM Scope Out: 12:34:54 PM Scope Withdrawal Time: 0 hours 15 minutes 23 seconds  Total Procedure Duration: 0 hours 17 minutes 20 seconds  Findings:      The terminal ileum appeared normal.      Two sessile polyps were found in the rectum and proximal transverse       colon. The polyps were 2 to 4 mm in size. These polyps were removed with       a cold snare. Resection and retrieval were complete.      A few small-mouthed diverticula were found in the recto-sigmoid colon,       sigmoid colon  and descending colon.      Internal hemorrhoids were found.      The recto-sigmoid colon and sigmoid colon were mildly tortuous. Impression:               - The examined portion of the ileum was normal.                           - Two 2 to 4 mm polyps in the rectum and in the                            proximal transverse colon, removed with a cold                            snare. Resected and retrieved.                           - Diverticulosis in the recto-sigmoid colon, in the                            sigmoid colon and in the descending  colon.                           - RECTALPAIN/BLEEDING DUE TO internal hemorrhoids.                           - Tortuous LEFT colon.                           - NO SOURCE FOR ANEMIA IDENTIFIED Moderate Sedation:      Moderate (conscious) sedation was administered by the endoscopy nurse       and supervised by the endoscopist. The following parameters were       monitored: oxygen saturation, heart rate, blood pressure, and response       to care. Total physician intraservice time was 43 minutes. Recommendation:           - Patient has a contact number available for                            emergencies. The signs and symptoms of potential                            delayed complications were discussed with the                            patient. Return to normal activities tomorrow.                            Written discharge instructions were provided to the                            patient.                           - High fiber diet.                           -  Continue present medications.                           - Await pathology results.                           - Repeat colonoscopy in 5-10 years for surveillance.                           - Return to GI office in 4 months. Procedure Code(s):        --- Professional ---                           [id number], Colonoscopy, flexible; with removal of                            tumor(s), polyp(s), or other lesion(s) by snare                            technique                           99153, Moderate sedation; each additional 15                            minutes intraservice time                           99153, Moderate sedation; each additional 15                            minutes intraservice time                           G0500, Moderate sedation services provided by the                            same physician or other qualified health care                            professional performing a gastrointestinal                             endoscopic service that sedation supports,                            requiring the presence of an independent trained                            observer to assist in the monitoring of the                            patient's level of consciousness and physiological                            status; initial 15 minutes of intra-service time;  patient age 28 years or older (additional time may                            be reported with [id number], as appropriate) Diagnosis Code(s):        --- Professional ---                           K64.8, Other hemorrhoids                           K62.1, Rectal polyp                           D12.3, Benign neoplasm of transverse colon (hepatic                            flexure or splenic flexure)                           K92.1, Melena (includes Hematochezia)                           D64.9, Anemia, unspecified                           K62.89, Other specified diseases of anus and rectum                           K57.30, Diverticulosis of large intestine without                            perforation or abscess without bleeding                           Q43.8, Other specified congenital malformations of                            intestine CPT copyright 2018 American Medical Association. All rights reserved. The codes documented in this report are preliminary and upon coder review may  be revised to meet current compliance requirements. Barney Drain, MD Barney Drain MD, MD 03/12/2018 1:14:15 PM This report has been signed electronically. Number of Addenda: 0

## 2018-03-12 NOTE — H&P (Signed)
Primary Care Physician:  Curlene Labrum, MD Primary Gastroenterologist:  Dr. Oneida Alar  Pre-Procedure History & Physical: HPI:  Albert Avila is a 43 y.o. male here for Anemia/rectal pain/bleeding.  Past Medical History:  Diagnosis Date  . GERD (gastroesophageal reflux disease)   . Helicobacter pylori gastritis    treated with triple therapy    Past Surgical History:  Procedure Laterality Date  . COLONOSCOPY  08/05/2010   XNT:ZGYFVC in the rectum and sigmoid colon/diverticulosis in the sigmoid colon/internal hemorrhoids    Prior to Admission medications   Medication Sig Start Date End Date Taking? Authorizing Provider  polyethylene glycol-electrolytes (TRILYTE) 420 g solution Take 4,000 mLs by mouth as directed. 10/05/17  Yes Jabron Weese L, MD  pseudoephedrine (SUDAFED) 120 MG 12 hr tablet Take 120 mg by mouth every 12 (twelve) hours as needed for congestion.    [provider]    Allergies as of 12/18/2017  . (Not on File)    Family History  Problem Relation Age of Onset  . Cancer Father        ? GI-unsure  . Breast cancer Mother     Social History   Socioeconomic History  . Marital status: Married    Spouse name: Not on file  . Number of children: 3  . Years of education: Not on file  . Highest education level: Not on file  Occupational History  . Occupation: truck Geophysicist/field seismologist    Comment: [place]  . Financial resource strain: Not on file  . Food insecurity:    Worry: Not on file    Inability: Not on file  . Transportation needs:    Medical: Not on file    Non-medical: Not on file  Tobacco Use  . Smoking status: Former Smoker    Packs/day: 0.50    Years: 10.00    Pack years: 5.00    Types: Cigarettes    Last attempt to quit: 02/25/2012    Years since quitting: 6.0  . Smokeless tobacco: Never Used  Substance and Sexual Activity  . Alcohol use: Yes    Alcohol/week: 0.0 standard drinks    Comment: 6 pack/weekend  . Drug use: No   . Sexual activity: Not on file  Lifestyle  . Physical activity:    Days per week: Not on file    Minutes per session: Not on file  . Stress: Not on file  Relationships  . Social connections:    Talks on phone: Not on file    Gets together: Not on file    Attends religious service: Not on file    Active member of club or organization: Not on file    Attends meetings of clubs or organizations: Not on file    Relationship status: Not on file  . Intimate partner violence:    Fear of current or ex partner: Not on file    Emotionally abused: Not on file    Physically abused: Not on file    Forced sexual activity: Not on file  Other Topics Concern  . Not on file  Social History Narrative  . Not on file    Review of Systems: See HPI, otherwise negative ROS   Physical Exam: BP 127/83   Pulse 86   Temp 98.9 F (37.2 C) (Oral)   Resp (!) 30   Ht 6\' 2"  (1.88 m)   Wt 118.8 kg   SpO2 100%   BMI 33.64 kg/m  General:   Alert,  pleasant and cooperative in NAD Head:  Normocephalic and atraumatic. Neck:  Supple; Lungs:  Clear throughout to auscultation.    Heart:  Regular rate and rhythm. Abdomen:  Soft, nontender and nondistended. Normal bowel sounds, without guarding, and without rebound.   Neurologic:  Alert and  oriented x4;  grossly normal neurologically.  Impression/Plan:     Anemia/rectal pain/bleeding  PLAN: EGD/TCS TODAY. DISCUSSED PROCEDURE, BENEFITS, & RISKS: < 1% chance of medication reaction, bleeding, perforation, or rupture of spleen/liver and < 10% chance of missed polyps.

## 2018-03-12 NOTE — OR Nursing (Signed)
Albert Avila had a procedure at [facility] on 03/12/18 and cannot return to work until 03/13/18 at 3:00 and was accompanied by his wife and cannot be left alone on 03/12/18.

## 2018-03-12 NOTE — Op Note (Signed)
[facility] Patient Name: Albert Avila Age Procedure Date: 03/12/2018 12:37 PM MRN: [id number] Date of Birth: 07-05-1975 Attending MD: Barney Drain MD, MD CSN: [id number] Age: 43 Admit Type: Outpatient Procedure:                Upper GI endoscopy [facility] COLD FORCEPS BIOPSY Indications:              Follow-up of Helicobacter pylori, Anemia Providers:                Barney Drain MD, MD, Janeece Riggers, RN, Randa Spike, Technician, Raphael Gibney, Technician Referring MD:             Curlene Labrum Medicines:                Meperidine 25 mg IV, Midazolam 3 mg IV Complications:            No immediate complications. Estimated Blood Loss:     Estimated blood loss was minimal. Procedure:                Pre-Anesthesia Assessment:                           - Prior to the procedure, a History and Physical                            was performed, and patient medications and                            allergies were reviewed. The patient's tolerance of                            previous anesthesia was also reviewed. The risks                            and benefits of the procedure and the sedation                            options and risks were discussed with the patient.                            All questions were answered, and informed consent                            was obtained. Prior Anticoagulants: The patient has                            taken no previous anticoagulant or antiplatelet                            agents. ASA Grade Assessment: I - A normal, healthy                            patient. After reviewing the risks and benefits,  the patient was deemed in satisfactory condition to                            undergo the procedure. After obtaining informed                            consent, the endoscope was passed under direct                            vision. Throughout the procedure, the patient's           blood pressure, pulse, and oxygen saturations were                            monitored continuously. The GIF-[id number] ([id number])                            scope was introduced through the mouth, and                            advanced to the second part of duodenum. The upper                            GI endoscopy was accomplished without difficulty.                            The patient tolerated the procedure well. Scope In: 12:41:01 PM Scope Out: 12:46:10 PM Total Procedure Duration: 0 hours 5 minutes 9 seconds  Findings:      LA Grade C (one or more mucosal breaks continuous between tops of 2 or       more mucosal folds, less than 75% circumference) esophagitis with no       bleeding was found.      Localized mild inflammation characterized by congestion (edema),       erosions and erythema was found in the gastric antrum. Biopsies were       taken with a cold forceps for Helicobacter pylori testing.      The examined duodenum was normal. Impression:               - ANEMIA MOST LIKELY DUE TO Reflux                            esophagitis/Gastritis. Moderate Sedation:      Moderate (conscious) sedation was administered by the endoscopy nurse       and supervised by the endoscopist. The following parameters were       monitored: oxygen saturation, heart rate, blood pressure, and response       to care. Total physician intraservice time was 43 minutes. Recommendation:           - Patient has a contact number available for                            emergencies. The signs and symptoms of potential  delayed complications were discussed with the                            patient. Return to normal activities tomorrow.                            Written discharge instructions were provided to the                            patient.                           - High fiber diet and low fat diet. LOSE WEIGHT TO                            BMI < 30.                            - Continue present medications. ADD OMEPRAZOLE 30                            MINS PRIOR TO FIRST MEAL.                           - Await pathology results.                           - Return to GI office in 4 months. SEE [facility] FOR                            REFERRAL FOR SLEEP APNEA. Procedure Code(s):        --- Professional ---                           [phone], Esophagogastroduodenoscopy, flexible,                            transoral; with biopsy, single or multiple                           99153, Moderate sedation; each additional 15                            minutes intraservice time                           99153, Moderate sedation; each additional 15                            minutes intraservice time                           G0500, Moderate sedation services provided by the                            same physician or other qualified health care  professional performing a gastrointestinal                            endoscopic service that sedation supports,                            requiring the presence of an independent trained                            observer to assist in the monitoring of the                            patient's level of consciousness and physiological                            status; initial 15 minutes of intra-service time;                            patient age 39 years or older (additional time may                            be reported with [phone], as appropriate) Diagnosis Code(s):        --- Professional ---                           K21.0, Gastro-esophageal reflux disease with                            esophagitis                           K29.70, Gastritis, unspecified, without bleeding                           D53.29, Helicobacter pylori [H. pylori] as the                            cause of diseases classified elsewhere                           D64.9, Anemia, unspecified CPT copyright 2018 American  Medical Association. All rights reserved. The codes documented in this report are preliminary and upon coder review may  be revised to meet current compliance requirements. Barney Drain, MD Barney Drain MD, MD 03/12/2018 1:18:45 PM This report has been signed electronically. Number of Addenda: 0

## 2018-03-12 NOTE — Discharge Instructions (Signed)
You had 2 polyps removed. Your RECTAL PAIN/BLEEDING IS MOST LIKELY DUE TO internal hemorrhoids. YOU HAVE DIVERTICULOSIS IN YOUR LEFT COLON. Your BORDERLINE LOW BLOOD COUNT IS MOST LIKELY DUE TO EROSIVE ESOPHAGITIS FROM ACID REFLUX. YOU HAVE MILD gastritis. I biopsied your stomach.    CONTINUE YOUR WEIGHT LOSS EFFORTS. YOUR BODY MASS INDEX IS OVER 30 WHICH MEANS YOU ARE OBESE. OBESITY IS ASSOCIATED WITH AN INCREASED FOR CIRRHOSIS AND ALL CANCERS, INCLUDING ESOPHAGEAL AND COLON CANCER. A WEIGHT OF 230 LBS OR LESS  WILL GET YOUR BODY MASS INDEX(BMI) UNDER 30.  DRINK WATER TO KEEP YOUR URINE LIGHT YELLOW.  FOLLOW A HIGH FIBER/LOW FAT DIET.  MEATS SHOULD BE BAKED, BROILED, OR BOILED. AVOID FRIED FOOD. SEE INFO BELOW.  AVOID REFLUX TRIGGERS. SEE INFO BELOW.   TO TREAT ESOPHAGITIS AND GASTRITIS, START OMEPRAZOLE.  TAKE 30 MINUTES PRIOR TO YOUR FIRST MEAL.   WHEN YOU HAVE RECTAL PAIN OR BLEEDING, USE PREPARATION H UP 4 TIMES A DAY FOR 7 DAYS THEN AS NEEDED TO CONTROL RECTAL BLEEDING, ITCHING, PRESSURE, BURNING, OR PAIN.   YOUR BIOPSY RESULTS WILL BE BACK IN 5 BUSINESS DAYS.  FOLLOW UP IN 4 MOS. You need LABS(CBC and ferritin) DRAWN ONE WEEK PRIOR TO NEXT OUTPATIENT VISIT.  SEE DR. Pleas Koch FOR A REFERRAL FOR SLEEP APNEA.   Next colonoscopy in 5-10 years.   ENDOSCOPY Care After Read the instructions outlined below and refer to this sheet in the next week. These discharge instructions provide you with general information on caring for yourself after you leave the hospital. While your treatment has been planned according to the most current medical practices available, unavoidable complications occasionally occur. If you have any problems or questions after discharge, call DR. Nyxon Strupp, [phone].  ACTIVITY  You may resume your regular activity, but move at a slower pace for the next 24 hours.   Take frequent rest periods for the next 24 hours.   Walking will help get rid of the air and  reduce the bloated feeling in your belly (abdomen).   No driving for 24 hours (because of the medicine (anesthesia) used during the test).   You may shower.   Do not sign any important legal documents or operate any machinery for 24 hours (because of the anesthesia used during the test).    NUTRITION  Drink plenty of fluids.   You may resume your normal diet as instructed by your doctor.   Begin with a light meal and progress to your normal diet. Heavy or fried foods are harder to digest and may make you feel sick to your stomach (nauseated).   Avoid alcoholic beverages for 24 hours or as instructed.    MEDICATIONS  You may resume your normal medications.   WHAT YOU CAN EXPECT TODAY  Some feelings of bloating in the abdomen.   Passage of more gas than usual.   Spotting of blood in your stool or on the toilet paper  .  IF YOU HAD POLYPS REMOVED DURING THE ENDOSCOPY:  Eat a soft diet IF YOU HAVE NAUSEA, BLOATING, ABDOMINAL PAIN, OR VOMITING.    FINDING OUT THE RESULTS OF YOUR TEST Not all test results are available during your visit. DR. Oneida Alar WILL CALL YOU WITHIN 7 DAYS OF YOUR PROCEDUE WITH YOUR RESULTS. Do not assume everything is normal if you have not heard from DR. Shandrell Boda IN ONE WEEK, CALL HER OFFICE AT [phone].  SEEK IMMEDIATE MEDICAL ATTENTION AND CALL THE OFFICE: [phone] IF:  You have  more than a spotting of blood in your stool.   Your belly is swollen (abdominal distention).   You are nauseated or vomiting.   You have a temperature over 101F.   You have abdominal pain or discomfort that is severe or gets worse throughout the day.   Lifestyle and home remedies TO MANAGE REFLUX  You may eliminate or reduce the frequency of heartburn by making the following lifestyle changes:   Control your weight. Being overweight is a major risk factor for heartburn and GERD. Excess pounds put pressure on your abdomen, pushing up your stomach and causing  acid to back up into your esophagus.    Eat smaller meals. 4 TO 6 MEALS A DAY. This reduces pressure on the lower esophageal sphincter, helping to prevent the valve from opening and acid from washing back into your esophagus.    Loosen your belt. Clothes that fit tightly around your waist put pressure on your abdomen and the lower esophageal sphincter.    Eliminate heartburn triggers. Everyone has specific triggers. Common triggers such as fatty or fried foods, spicy food, tomato sauce, carbonated beverages, alcohol, chocolate, mint, garlic, onion, caffeine and nicotine may make heartburn worse.    Avoid stooping or bending. Tying your shoes is OK. Bending over for longer periods to weed your garden isn't, especially soon after eating.    Don't lie down after a meal. Wait at least three to four hours after eating before going to bed, and don't lie down right after eating.    PUT THE HEAD OF YOUR BED ON 6 INCH BLOCKS.   Alternative medicine  Several home remedies exist for treating GERD, but they provide only temporary relief. They include drinking baking soda (sodium bicarbonate) added to water or drinking other fluids such as baking soda mixed with cream of tartar and water.   Although these liquids create temporary relief by neutralizing, washing away or buffering acids, eventually they aggravate the situation by adding gas and fluid to your stomach, increasing pressure and causing more acid reflux. Further, adding more sodium to your diet may increase your blood pressure and add stress to your heart, and excessive bicarbonate ingestion can alter the acid-base balance in your body.   Low-Fat Diet BREADS, CEREALS, PASTA, RICE, DRIED PEAS, AND BEANS These products are high in carbohydrates and most are low in fat. Therefore, they can be increased in the diet as substitutes for fatty foods. They too, however, contain calories and should not be eaten in excess. Cereals can be eaten for  snacks as well as for breakfast.   FRUITS AND VEGETABLES It is good to eat fruits and vegetables. Besides being sources of fiber, both are rich in vitamins and some minerals. They help you get the daily allowances of these nutrients. Fruits and vegetables can be used for snacks and desserts.  MEATS Limit lean meat, chicken, [place], and fish to no more than 6 ounces per day. Beef, Pork, and Lamb Use lean cuts of beef, pork, and lamb. Lean cuts include:  Extra-lean ground beef.  Arm roast.  Sirloin tip.  Center-cut ham.  Round steak.  Loin chops.  Rump roast.  Tenderloin.  Trim all fat off the outside of meats before cooking. It is not necessary to severely decrease the intake of red meat, but lean choices should be made. Lean meat is rich in protein and contains a highly absorbable form of iron. Premenopausal women, in particular, should avoid reducing lean red meat because this could  increase the risk for low red blood cells (iron-deficiency anemia).  Chicken and [place] These are good sources of protein. The fat of poultry can be reduced by removing the skin and underlying fat layers before cooking. Chicken and [place] can be substituted for lean red meat in the diet. Poultry should not be fried or covered with high-fat sauces. Fish and Shellfish Fish is a good source of protein. Shellfish contain cholesterol, but they usually are low in saturated fatty acids. The preparation of fish is important. Like chicken and [place], they should not be fried or covered with high-fat sauces. EGGS Egg whites contain no fat or cholesterol. They can be eaten often. Try 1 to 2 egg whites instead of whole eggs in recipes or use egg substitutes that do not contain yolk. MILK AND DAIRY PRODUCTS Use skim or 1% milk instead of 2% or whole milk. Decrease whole milk, natural, and processed cheeses. Use nonfat or low-fat (2%) cottage cheese or low-fat cheeses made from vegetable oils. Choose nonfat or low-fat (1 to  2%) yogurt. Experiment with evaporated skim milk in recipes that call for heavy cream. Substitute low-fat yogurt or low-fat cottage cheese for sour cream in dips and salad dressings. Have at least 2 servings of low-fat dairy products, such as 2 glasses of skim (or 1%) milk each day to help get your daily calcium intake. FATS AND OILS Reduce the total intake of fats, especially saturated fat. Butterfat, lard, and beef fats are high in saturated fat and cholesterol. These should be avoided as much as possible. Vegetable fats do not contain cholesterol, but certain vegetable fats, such as coconut oil, palm oil, and palm kernel oil are very high in saturated fats. These should be limited. These fats are often used in bakery goods, processed foods, popcorn, oils, and nondairy creamers. Vegetable shortenings and some peanut butters contain hydrogenated oils, which are also saturated fats. Read the labels on these foods and check for saturated vegetable oils. Unsaturated vegetable oils and fats do not raise blood cholesterol. However, they should be limited because they are fats and are high in calories. Total fat should still be limited to 30% of your daily caloric intake. Desirable liquid vegetable oils are corn oil, cottonseed oil, olive oil, canola oil, safflower oil, soybean oil, and sunflower oil. Peanut oil is not as good, but small amounts are acceptable. Buy a heart-healthy tub margarine that has no partially hydrogenated oils in the ingredients. Mayonnaise and salad dressings often are made from unsaturated fats, but they should also be limited because of their high calorie and fat content. Seeds, nuts, peanut butter, olives, and avocados are high in fat, but the fat is mainly the unsaturated type. These foods should be limited mainly to avoid excess calories and fat. OTHER EATING TIPS Snacks  Most sweets should be limited as snacks. They tend to be rich in calories and fats, and their caloric content  outweighs their nutritional value. Some good choices in snacks are graham crackers, melba toast, soda crackers, bagels (no egg), English muffins, fruits, and vegetables. These snacks are preferable to snack crackers, [place] fries, TORTILLA CHIPS, and POTATO chips. Popcorn should be air-popped or cooked in small amounts of liquid vegetable oil. Desserts Eat fruit, low-fat yogurt, and fruit ices instead of pastries, cake, and cookies. Sherbet, angel food cake, gelatin dessert, frozen low-fat yogurt, or other frozen products that do not contain saturated fat (pure fruit juice bars, frozen ice pops) are also acceptable.  COOKING METHODS Choose those  methods that use little or no fat. They include: Poaching.  Braising.  Steaming.  Grilling.  Baking.  Stir-frying.  Broiling.  Microwaving.  Foods can be cooked in a nonstick pan without added fat, or use a nonfat cooking spray in regular cookware. Limit fried foods and avoid frying in saturated fat. Add moisture to lean meats by using water, broth, cooking wines, and other nonfat or low-fat sauces along with the cooking methods mentioned above. Soups and stews should be chilled after cooking. The fat that forms on top after a few hours in the refrigerator should be skimmed off. When preparing meals, avoid using excess salt. Salt can contribute to raising blood pressure in some people.  EATING AWAY FROM HOME Order entres, potatoes, and vegetables without sauces or butter. When meat exceeds the size of a deck of cards (3 to 4 ounces), the rest can be taken home for another meal. Choose vegetable or fruit salads and ask for low-calorie salad dressings to be served on the side. Use dressings sparingly. Limit high-fat toppings, such as bacon, crumbled eggs, cheese, sunflower seeds, and olives. Ask for heart-healthy tub margarine instead of butter.   High-Fiber Diet A high-fiber diet changes your normal diet to include more whole grains, legumes, fruits,  and vegetables. Changes in the diet involve replacing refined carbohydrates with unrefined foods. The calorie level of the diet is essentially unchanged. The Dietary Reference Intake (recommended amount) for adult males is 38 grams per day. For adult females, it is 25 grams per day. Pregnant and lactating women should consume 28 grams of fiber per day. Fiber is the intact part of a plant that is not broken down during digestion. Functional fiber is fiber that has been isolated from the plant to provide a beneficial effect in the body.  PURPOSE  Increase stool bulk.   Ease and regulate bowel movements.   Lower cholesterol.   REDUCE RISK OF COLON CANCER  INDICATIONS THAT YOU NEED MORE FIBER  Constipation and hemorrhoids.   Uncomplicated diverticulosis (intestine condition) and irritable bowel syndrome.   Weight management.   As a protective measure against hardening of the arteries (atherosclerosis), diabetes, and cancer.   GUIDELINES FOR INCREASING FIBER IN THE DIET  Start adding fiber to the diet slowly. A gradual increase of about 5 more grams (2 slices of whole-wheat bread, 2 servings of most fruits or vegetables, or 1 bowl of high-fiber cereal) per day is best. Too rapid an increase in fiber may result in constipation, flatulence, and bloating.   Drink enough water and fluids to keep your urine clear or pale yellow. Water, juice, or caffeine-free drinks are recommended. Not drinking enough fluid may cause constipation.   Eat a variety of high-fiber foods rather than one type of fiber.   Try to increase your intake of fiber through using high-fiber foods rather than fiber pills or supplements that contain small amounts of fiber.   The goal is to change the types of food eaten. Do not supplement your present diet with high-fiber foods, but replace foods in your present diet.   INCLUDE A VARIETY OF FIBER SOURCES  Replace refined and processed grains with whole grains, canned  fruits with fresh fruits, and incorporate other fiber sources. White rice, white breads, and most bakery goods contain little or no fiber.   Brown whole-grain rice, buckwheat oats, and many fruits and vegetables are all good sources of fiber. These include: broccoli, Brussels sprouts, cabbage, cauliflower, beets, sweet potatoes, white potatoes (skin  on), carrots, tomatoes, eggplant, squash, berries, fresh fruits, and dried fruits.   Cereals appear to be the richest source of fiber. Cereal fiber is found in whole grains and bran. Bran is the fiber-rich outer coat of cereal grain, which is largely removed in refining. In whole-grain cereals, the bran remains. In breakfast cereals, the largest amount of fiber is found in those with "bran" in their names. The fiber content is sometimes indicated on the label.   You may need to include additional fruits and vegetables each day.   In baking, for 1 cup white flour, you may use the following substitutions:   1 cup whole-wheat flour minus 2 tablespoons.   1/2 cup white flour plus 1/2 cup whole-wheat flour.    Diverticulosis Diverticulosis is a common condition that develops when small pouches (diverticula) form in the wall of the colon. The risk of diverticulosis increases with age. It happens more often in people who eat a low-fiber diet. Most individuals with diverticulosis have no symptoms. Those individuals with symptoms usually experience belly (abdominal) pain, constipation, or loose stools (diarrhea).  HOME CARE INSTRUCTIONS  Increase the amount of fiber in your diet as directed by your caregiver or dietician. This may reduce symptoms of diverticulosis.   Drink at least 6 to 8 glasses of water each day to prevent constipation.   Try not to strain when you have a bowel movement.   Avoiding nuts and seeds to prevent complications is NOT NECESSARY.      FOODS HAVING HIGH FIBER CONTENT INCLUDE:  Fruits. Apple, peach, pear, tangerine,  raisins, prunes.   Vegetables. Brussels sprouts, asparagus, broccoli, cabbage, carrot, cauliflower, romaine lettuce, spinach, summer squash, tomato, winter squash, zucchini.   Starchy Vegetables. Baked beans, kidney beans, lima beans, split peas, lentils, potatoes (with skin).   Grains. Whole wheat bread, brown rice, bran flake cereal, plain oatmeal, white rice, shredded wheat, bran muffins.   Polyps, Colon  A polyp is extra tissue that grows inside your body. Colon polyps grow in the large intestine. The large intestine, also called the colon, is part of your digestive system. It is a long, hollow tube at the end of your digestive tract where your body makes and stores stool. Most polyps are not dangerous. They are benign. This means they are not cancerous. But over time, some types of polyps can turn into cancer. Polyps that are smaller than a pea are usually not harmful. But larger polyps could someday become or may already be cancerous. To be safe, doctors remove all polyps and test them.   WHO GETS POLYPS? Anyone can get polyps, but certain people are more likely than others. You may have a greater chance of getting polyps if:  You are over 50.   You have had polyps before.   Someone in your family has had polyps.   Someone in your family has had cancer of the large intestine.   Find out if someone in your family has had polyps. You may also be more likely to get polyps if you:   Eat a lot of fatty foods   Smoke   Drink alcohol   Do not exercise  Eat too much   PREVENTION There is not one sure way to prevent polyps. You might be able to lower your risk of getting them if you:  Eat more fruits and vegetables and less fatty food.   Do not smoke.   Avoid alcohol.   Exercise every day.   Lose weight  if you are overweight.   Eating more calcium and folate can also lower your risk of getting polyps. Some foods that are rich in calcium are milk, cheese, and broccoli. Some  foods that are rich in folate are chickpeas, kidney beans, and spinach.

## 2018-03-14 ENCOUNTER — Encounter: Payer: Self-pay | Admitting: Gastroenterology

## 2018-03-15 ENCOUNTER — Encounter (HOSPITAL_COMMUNITY): Payer: Self-pay | Admitting: Gastroenterology

## 2018-03-16 ENCOUNTER — Telehealth: Payer: Self-pay | Admitting: Gastroenterology

## 2018-03-16 NOTE — Telephone Encounter (Signed)
PLEASE CALL PT. HE HAD ONE SIMPLE ADENOMAS AND ONE HYPERPLASTIC POLYP REMOVED.   Your BORDERLINE LOW BLOOD COUNT IS MOST LIKELY DUE TO EROSIVE ESOPHAGITIS FROM ACID REFLUX AND RECTA LBLEEDING FROM HEMORRHOIDS.     CONTINUE YOUR WEIGHT LOSS EFFORTS. A WEIGHT OF 230 LBS OR LESS  WILL GET YOUR BODY MASS INDEX(BMI) UNDER 30.  DRINK WATER TO KEEP YOUR URINE LIGHT YELLOW.  FOLLOW A HIGH FIBER/LOW FAT DIET.  MEATS SHOULD BE BAKED, BROILED, OR BOILED. AVOID FRIED FOOD.   AVOID REFLUX TRIGGERS.    TO TREAT ESOPHAGITIS AND GASTRITIS, START OMEPRAZOLE.  TAKE 30 MINUTES PRIOR TO YOUR FIRST MEAL.   WHEN YOU HAVE RECTAL PAIN OR BLEEDING, USE PREPARATION H UP 4 TIMES A DAY FOR 7 DAYS THEN AS NEEDED TO CONTROL RECTAL BLEEDING, ITCHING, PRESSURE, BURNING, OR PAIN.  FOLLOW UP IN 4 MOS. You need LABS(CBC and ferritin) DRAWN ONE WEEK PRIOR TO NEXT OUTPATIENT VISIT.  SEE DR. Pleas Koch FOR A REFERRAL FOR SLEEP APNEA.  Next colonoscopy in 5-10 years.

## 2018-03-19 ENCOUNTER — Other Ambulatory Visit: Payer: Self-pay

## 2018-03-19 DIAGNOSIS — D649 Anemia, unspecified: Secondary | ICD-10-CM

## 2018-03-19 NOTE — Telephone Encounter (Signed)
Pt is aware and lab orders on file for 07/04/2018.

## 2018-03-19 NOTE — Telephone Encounter (Signed)
SCHEDULED AND ON RECALL  °

## 2018-03-26 DIAGNOSIS — E782 Mixed hyperlipidemia: Secondary | ICD-10-CM | POA: Diagnosis not present

## 2018-03-26 DIAGNOSIS — G4733 Obstructive sleep apnea (adult) (pediatric): Secondary | ICD-10-CM | POA: Diagnosis not present

## 2018-03-26 DIAGNOSIS — K219 Gastro-esophageal reflux disease without esophagitis: Secondary | ICD-10-CM | POA: Diagnosis not present

## 2018-04-09 DIAGNOSIS — G4733 Obstructive sleep apnea (adult) (pediatric): Secondary | ICD-10-CM | POA: Diagnosis not present

## 2018-04-09 DIAGNOSIS — R0683 Snoring: Secondary | ICD-10-CM | POA: Diagnosis not present

## 2018-04-13 DIAGNOSIS — G4733 Obstructive sleep apnea (adult) (pediatric): Secondary | ICD-10-CM | POA: Diagnosis not present

## 2018-05-25 DIAGNOSIS — Z0001 Encounter for general adult medical examination with abnormal findings: Secondary | ICD-10-CM | POA: Diagnosis not present

## 2018-05-30 DIAGNOSIS — Z0001 Encounter for general adult medical examination with abnormal findings: Secondary | ICD-10-CM | POA: Diagnosis not present

## 2018-06-12 ENCOUNTER — Other Ambulatory Visit: Payer: Self-pay | Admitting: *Deleted

## 2018-06-12 ENCOUNTER — Encounter: Payer: Self-pay | Admitting: *Deleted

## 2018-06-12 DIAGNOSIS — D649 Anemia, unspecified: Secondary | ICD-10-CM

## 2018-06-28 ENCOUNTER — Other Ambulatory Visit: Payer: Self-pay

## 2018-06-28 ENCOUNTER — Other Ambulatory Visit (HOSPITAL_COMMUNITY)
Admission: RE | Admit: 2018-06-28 | Discharge: 2018-06-28 | Disposition: A | Discharge status: Home / Self Care | Payer: 59 | Source: Ambulatory Visit | Attending: Gastroenterology | Admitting: Gastroenterology

## 2018-06-28 DIAGNOSIS — D649 Anemia, unspecified: Secondary | ICD-10-CM

## 2018-06-28 LAB — CBC WITH DIFFERENTIAL/PLATELET
Abs Immature Granulocytes: 0.02 10*3/uL (ref 0.00–0.07)
Basophils Absolute: 0.1 10*3/uL (ref 0.0–0.1)
Basophils Relative: 1 %
Eosinophils Absolute: 0.2 10*3/uL (ref 0.0–0.5)
Eosinophils Relative: 2 %
HCT: 38.6 % — ABNORMAL LOW (ref 39.0–52.0)
Hemoglobin: 13 g/dL (ref 13.0–17.0)
Immature Granulocytes: 0 %
Lymphocytes Relative: 25 %
Lymphs Abs: 2 10*3/uL (ref 0.7–4.0)
MCH: 31.4 pg (ref 26.0–34.0)
MCHC: 33.7 g/dL (ref 30.0–36.0)
MCV: 93.2 fL (ref 80.0–100.0)
Monocytes Absolute: 0.7 10*3/uL (ref 0.1–1.0)
Monocytes Relative: 9 %
Neutro Abs: 5.2 10*3/uL (ref 1.7–7.7)
Neutrophils Relative %: 63 %
Platelets: 236 10*3/uL (ref 150–400)
RBC: 4.14 MIL/uL — ABNORMAL LOW (ref 4.22–5.81)
RDW: 13.9 % (ref 11.5–15.5)
WBC: 8.2 10*3/uL (ref 4.0–10.5)
nRBC: 0 % (ref 0.0–0.2)

## 2018-06-28 LAB — FERRITIN: Ferritin: 230 ng/mL (ref 24–336)

## 2018-06-28 NOTE — Progress Notes (Signed)
Pt is aware and lab orders on file for Dec 2020.

## 2018-07-02 NOTE — Progress Notes (Signed)
CC'D TO PCP °

## 2018-07-12 ENCOUNTER — Ambulatory Visit: Payer: 59 | Admitting: Nurse Practitioner

## 2018-07-12 ENCOUNTER — Other Ambulatory Visit: Payer: Self-pay

## 2018-07-12 ENCOUNTER — Encounter: Payer: Self-pay | Admitting: Nurse Practitioner

## 2018-07-12 VITALS — BP 119/81 | HR 77 | Temp 98.2°F | Ht 74.0 in | Wt 258.6 lb

## 2018-07-12 DIAGNOSIS — Z800 Family history of malignant neoplasm of digestive organs: Secondary | ICD-10-CM

## 2018-07-12 DIAGNOSIS — D649 Anemia, unspecified: Secondary | ICD-10-CM

## 2018-07-12 DIAGNOSIS — K625 Hemorrhage of anus and rectum: Secondary | ICD-10-CM | POA: Diagnosis not present

## 2018-07-12 DIAGNOSIS — K219 Gastro-esophageal reflux disease without esophagitis: Secondary | ICD-10-CM | POA: Diagnosis not present

## 2018-07-12 NOTE — Assessment & Plan Note (Addendum)
Previous normocytic anemia.  His hemoglobin most recently was 12.0 with normal indices.  Ferritin was normal.  At this point we will recheck CBC and ferritin.  His labs are just recently recheck on recall and essentially normal.  Recommended recheck labs in 6 months.  We will order these today for future draw.  Follow-up in 1 year.  Call for any further bleeding or signs of symptomatic anemia.

## 2018-07-12 NOTE — Patient Instructions (Addendum)
Your health issues we discussed today were:   Colon polyps and family history of colon cancer: 1. Notify us if you see any further rectal bleeding 2. Not because of your recent colon polyps and family history of colon cancer we will need to change the recommendation for when you should have your next colonoscopy 3. This will likely be 5 years at the latest 4. We will make further recommendations after speak with Dr. Oneida Alar  GERD (reflux/heartburn) with irritated lining of your stomach and esophagus: 1. Continue to take your acid blocker 2. Call us if you have any severe worsening symptoms  Anemia: 1. Have your labs checked when you are able to 2. We will call you with the results  Overall I recommend:  1. Continue your other current medications 2. Return for follow-up in 1 year 3. Call us if you have any questions or concerns   Because of recent events of COVID-19 ("Coronavirus"), follow CDC recommendations:  1. Wash your hand frequently 2. Avoid touching your face 3. Stay away from people who are sick 4. If you have symptoms such as fever, cough, shortness of breath then call your healthcare provider for further guidance 5. If you are sick, STAY AT HOME unless otherwise directed by your healthcare provider. 6. Follow directions from state and national officials regarding staying safe   At [facility] we value your feedback. You may receive a survey about your visit today. Please share your experience as we strive to create trusting relationships with our patients to provide genuine, compassionate, quality care.  We appreciate your understanding and patience as we review any laboratory studies, imaging, and other diagnostic tests that are ordered as we care for you. Our office policy is 5 business days for review of these results, and any emergent or urgent results are addressed in a timely manner for your best interest. If you do not hear from our office in 1 week,  please contact us.   We also encourage the use of MyChart, which contains your medical information for your review as well. If you are not enrolled in this feature, an access code is on this after visit summary for your convenience. Thank you for allowing [place] to be involved in your care.  It was great to see you today!  I hope you have a great day!!

## 2018-07-12 NOTE — Assessment & Plan Note (Signed)
Currently still taking PPI.  No GERD symptoms ongoing.  Recommend he continue his PPI for now.  Follow-up in 1 year.

## 2018-07-12 NOTE — Progress Notes (Addendum)
REVIEWED. Two simple adenomas removed MAR 2020. Father passed with CRC age < 46. Next TCS MAR 2025.  Referring Provider: Curlene Labrum, MD Primary Care Physician:  Curlene Labrum, MD Primary GI:  Dr. Oneida Alar  Chief Complaint  Patient presents with  . pp f/u    doing ok    HPI:   Albert Avila is a 43 y.o. male who presents for 4 months post procedure follow-up.  The patient was last seen in our office 09/28/2017 for normocytic anemia and rectal bleeding.  At that time noted colonoscopy in 2012, rare heartburn, rare hemorrhoid issues with constipation.  When he does have rectal bleeding in the scant toilet tissue hematochezia.  No other overt GI complaints.  Overall normocytic anemia in the setting of low-volume hematochezia and NSAID use.  Recommended avoid all NSAIDs.  Complete labs including CBC and ferritin.  Follow-up in 6 months.  He was found to be mildly anemic with a hemoglobin of 12.0 but normal indices.  Ferritin essentially normal.  Recommended colonoscopy with possible EGD because it is been over 5 years since his last colonoscopy.  Colonoscopy initially scheduled for 12/18/2017 due to cost.  This was rescheduled to 03/12/2018.  Colonoscopy found two 2 to 4 mm polyps in the rectum status post resection, diverticulosis in the rectosigmoid colon, sigmoid colon, and descending colon.  Rectal pain and bleeding felt due to internal hemorrhoids.  No overt source for anemia identified.  Polyps found to be tubular adenoma on surgical pathology.  Recommended repeat colonoscopy in 5 to 10 years, use Preparation H up to 4 times a day for 7 days for rectal pain or bleeding.  EGD the same day found LA grade C esophagitis but no active bleeding, localized mild inflammation in the gastric antrum status post biopsy.  Anemia most likely due to reflux esophagitis/gastritis.  Surgical pathology found the biopsies to be chronic gastritis negative for H. pylori.  Recommended continued weight loss,  high-fiber/low-fat diet, avoid reflux triggers, start omeprazole daily, follow-up in 4 months for CBC and ferritin.  Today he states he's doing well. Still on PPI. No GERD symptoms. Denies abdominal pain, N/V, hematochezia, melena, fever, chills, unintentional weight loss. Denies URI or flu-like symptoms. Denies loss of sense of taste or smell. Denies chest pain, dyspnea, dizziness, lightheadedness, syncope, near syncope. Denies any other upper or lower GI symptoms.  Denies NSAIDs or ASA powders.  Patient has a copy of his dad's death certificate on his phone which shows he passed from Adenocarcinoma of the colon at age 33.  Past Medical History:  Diagnosis Date  . GERD (gastroesophageal reflux disease)   . Helicobacter pylori gastritis    treated with triple therapy, MAR 2020: BIOPSY NEGATIVE    Past Surgical History:  Procedure Laterality Date  . BIOPSY  03/12/2018   Procedure: BIOPSY;  Surgeon: Danie Binder, MD;  Location: [facility];  Service: Endoscopy;;  gastric   . COLONOSCOPY  08/05/2010   XNA:TFTDDU in the rectum and sigmoid colon/diverticulosis in the sigmoid colon/internal hemorrhoids  . COLONOSCOPY N/A 03/12/2018   Procedure: COLONOSCOPY;  Surgeon: Danie Binder, MD;  Location: [facility];  Service: Endoscopy;  Laterality: N/A;  12:00pm  . ESOPHAGOGASTRODUODENOSCOPY N/A 03/12/2018   Procedure: ESOPHAGOGASTRODUODENOSCOPY (EGD);  Surgeon: Danie Binder, MD;  Location: [facility];  Service: Endoscopy;  Laterality: N/A;  . POLYPECTOMY  03/12/2018   Procedure: POLYPECTOMY;  Surgeon: Danie Binder, MD;  Location: [facility];  Service: Endoscopy;;  transverse colon(CSx1), rectal (CSx1)    Current Outpatient Medications  Medication Sig Dispense Refill  . omeprazole (PRILOSEC) 20 MG capsule 1 PO 30 MINS PRIOR TO BREAKFAST. 90 capsule 3  . pseudoephedrine (SUDAFED) 120 MG 12 hr tablet Take 120 mg by mouth every 12 (twelve) hours as needed for congestion.     No current  facility-administered medications for this visit.     Allergies as of 07/12/2018  . (No Known Allergies)    Family History  Problem Relation Age of Onset  . Cancer Father        ? GI-unsure  . Breast cancer Mother     Social History   Socioeconomic History  . Marital status: Married    Spouse name: Not on file  . Number of children: 3  . Years of education: Not on file  . Highest education level: Not on file  Occupational History  . Occupation: truck Geophysicist/field seismologist    Comment: [employer]  . Financial resource strain: Not on file  . Food insecurity    Worry: Not on file    Inability: Not on file  . Transportation needs    Medical: Not on file    Non-medical: Not on file  Tobacco Use  . Smoking status: Former Smoker    Packs/day: 0.50    Years: 10.00    Pack years: 5.00    Types: Cigarettes    Quit date: 02/25/2012    Years since quitting: 6.3  . Smokeless tobacco: Never Used  Substance and Sexual Activity  . Alcohol use: Yes    Alcohol/week: 0.0 standard drinks    Comment: 6 pack/weekend  . Drug use: No  . Sexual activity: Not on file  Lifestyle  . Physical activity    Days per week: Not on file    Minutes per session: Not on file  . Stress: Not on file  Relationships  . Social Herbalist on phone: Not on file    Gets together: Not on file    Attends religious service: Not on file    Active member of club or organization: Not on file    Attends meetings of clubs or organizations: Not on file    Relationship status: Not on file  Other Topics Concern  . Not on file  Social History Narrative  . Not on file    Review of Systems: Complete ROS negative except as per HPI.   Physical Exam: BP 119/81   Pulse 77   Temp 98.2 F (36.8 C) (Oral)   Ht 6\' 2"  (1.88 m)   Wt 258 lb 9.6 oz (117.3 kg)   BMI 33.20 kg/m  General:   Alert and oriented. Pleasant and cooperative. Well-nourished and well-developed.  Head:  Normocephalic and atraumatic.  Eyes:  Without icterus, sclera clear and conjunctiva pink.  Ears:  Normal auditory acuity. Mouth:  No deformity or lesions, oral mucosa pink.  Throat/Neck:  Supple, without mass or thyromegaly. Cardiovascular:  S1, S2 present without murmurs appreciated. Normal pulses noted. Extremities without clubbing or edema. Respiratory:  Clear to auscultation bilaterally. No wheezes, rales, or rhonchi. No distress.  Gastrointestinal:  +BS, soft, non-tender and non-distended. No HSM noted. No guarding or rebound. No masses appreciated.  Rectal:  Deferred  Musculoskalatal:  Symmetrical without gross deformities. Normal posture. Skin:  Intact without significant lesions or rashes. Neurologic:  Alert and oriented x4;  grossly normal neurologically. Psych:  Alert and cooperative. Normal  mood and affect. Heme/Lymph/Immune: No significant cervical adenopathy. No excessive bruising noted.    07/12/2018 11:33 AM   Disclaimer: This note was dictated with voice recognition software. Similar sounding words can inadvertently be transcribed and may not be corrected upon review.

## 2018-07-12 NOTE — Assessment & Plan Note (Signed)
Today he had a copy of his dad's death certificate indicating his father passed away 44 years old from adenocarcinoma the colon.  This will likely change his screening interval.  I will discuss with Dr. Oneida Alar.  Likely maximum screening interval of 5 years, unsure if this would change based on colonoscopy recently with 2 small simple adenomas.  Further recommendations to follow.

## 2018-07-12 NOTE — Assessment & Plan Note (Signed)
No further rectal bleeding or dark stools.  Currently doing well after his procedure.  I will check CBC and ferritin per previous recommendations.  Follow-up in 1 year.  We will need to update his screening interval due to regulations of his father passing from colon adenocarcinoma at age 44.

## 2018-07-13 ENCOUNTER — Encounter: Payer: Self-pay | Admitting: Gastroenterology

## 2018-07-13 ENCOUNTER — Telehealth: Payer: Self-pay | Admitting: Nurse Practitioner

## 2018-07-13 NOTE — Telephone Encounter (Signed)
Please tell the patient I reviewed his family history with Dr. Oneida Alar. We'll bring him back in 5 years (March 2025) for repeat colonoscopy. He is already on recall list. Call if any questions or concerns.

## 2018-07-16 NOTE — Progress Notes (Signed)
cc'd to pcp 

## 2018-07-16 NOTE — Telephone Encounter (Signed)
PT is aware.

## 2018-11-28 ENCOUNTER — Other Ambulatory Visit: Payer: Self-pay

## 2018-11-28 DIAGNOSIS — D649 Anemia, unspecified: Secondary | ICD-10-CM

## 2018-12-17 ENCOUNTER — Other Ambulatory Visit (HOSPITAL_COMMUNITY)
Admission: RE | Admit: 2018-12-17 | Discharge: 2018-12-17 | Disposition: A | Discharge status: Home / Self Care | Payer: 59 | Source: Ambulatory Visit | Attending: Gastroenterology | Admitting: Gastroenterology

## 2018-12-17 ENCOUNTER — Other Ambulatory Visit: Payer: Self-pay

## 2018-12-17 DIAGNOSIS — D649 Anemia, unspecified: Secondary | ICD-10-CM | POA: Insufficient documentation

## 2018-12-17 LAB — FERRITIN: Ferritin: 264 ng/mL (ref 24–336)

## 2018-12-18 ENCOUNTER — Other Ambulatory Visit (HOSPITAL_COMMUNITY)
Admission: RE | Admit: 2018-12-18 | Discharge: 2018-12-18 | Disposition: A | Discharge status: Home / Self Care | Payer: 59 | Source: Ambulatory Visit | Attending: Gastroenterology | Admitting: Gastroenterology

## 2018-12-18 DIAGNOSIS — D649 Anemia, unspecified: Secondary | ICD-10-CM | POA: Diagnosis present

## 2018-12-18 LAB — CBC WITH DIFFERENTIAL/PLATELET
Abs Immature Granulocytes: 0.02 10*3/uL (ref 0.00–0.07)
Basophils Absolute: 0 10*3/uL (ref 0.0–0.1)
Basophils Relative: 0 %
Eosinophils Absolute: 0.1 10*3/uL (ref 0.0–0.5)
Eosinophils Relative: 2 %
HCT: 39.7 % (ref 39.0–52.0)
Hemoglobin: 13.6 g/dL (ref 13.0–17.0)
Immature Granulocytes: 0 %
Lymphocytes Relative: 28 %
Lymphs Abs: 2.1 10*3/uL (ref 0.7–4.0)
MCH: 32.5 pg (ref 26.0–34.0)
MCHC: 34.3 g/dL (ref 30.0–36.0)
MCV: 95 fL (ref 80.0–100.0)
Monocytes Absolute: 0.7 10*3/uL (ref 0.1–1.0)
Monocytes Relative: 9 %
Neutro Abs: 4.5 10*3/uL (ref 1.7–7.7)
Neutrophils Relative %: 61 %
Platelets: 271 10*3/uL (ref 150–400)
RBC: 4.18 MIL/uL — ABNORMAL LOW (ref 4.22–5.81)
RDW: 13.1 % (ref 11.5–15.5)
WBC: 7.4 10*3/uL (ref 4.0–10.5)
nRBC: 0 % (ref 0.0–0.2)

## 2018-12-20 ENCOUNTER — Telehealth: Payer: Self-pay | Admitting: Gastroenterology

## 2018-12-20 NOTE — Telephone Encounter (Signed)
Spoke with pt. Pt notified of results.  

## 2018-12-20 NOTE — Telephone Encounter (Signed)
PLEASE CALL PT. HIS IRON STORES/FERRITIN IS NORMAL.

## 2018-12-21 NOTE — Telephone Encounter (Addendum)
PLEASE CALL PT. HIS BLOOD COUNT IS NORMAL. HIS BORDERLINE LOW BLOOD COUNT WAS MOST LIKELY DUE TO EROSIVE ESOPHAGITIS FROM ACID REFLUX AND RECTAL BLEEDING FROM HEMORRHOIDS.   CONTINUE OMEPRAZOLE.  TAKE 30 MINUTES PRIOR TO YOUR FIRST MEAL. FOLLOW UP IN 1 YEAR.

## 2018-12-24 NOTE — Telephone Encounter (Signed)
I spoke with patient about results and he verbalized understanding and had no questions. He states he has 2 rx's for omperazole 20mg  and 40mg . Wants to know which dosage he needs to take and if a new Rx for this could be sent in?

## 2018-12-25 NOTE — Telephone Encounter (Signed)
REMINDER IN EPIC °

## 2018-12-25 NOTE — Progress Notes (Signed)
Cc'd to pcp 

## 2018-12-26 NOTE — Telephone Encounter (Signed)
PLEASE CALL PT. HE SHOULD TAKE OMEPRAZOLE 20 MG 30 MINS PRIOR TO HIS FIRST MEAL, #90, RF x3. CALL MEDS IN.

## 2019-01-01 NOTE — Telephone Encounter (Signed)
Pt is aware and Rx called to [place] at [facility] in [place].

## 2019-05-28 ENCOUNTER — Encounter: Payer: Self-pay | Admitting: Gastroenterology

## 2019-10-22 IMAGING — DX DG ABDOMEN ACUTE W/ 1V CHEST
4 series · 4 of 4 positions shown · non-contrast
Comparison: None.

CLINICAL DATA: Constipation

EXAM:
DG ABDOMEN ACUTE W/ 1V CHEST

[chest pa]
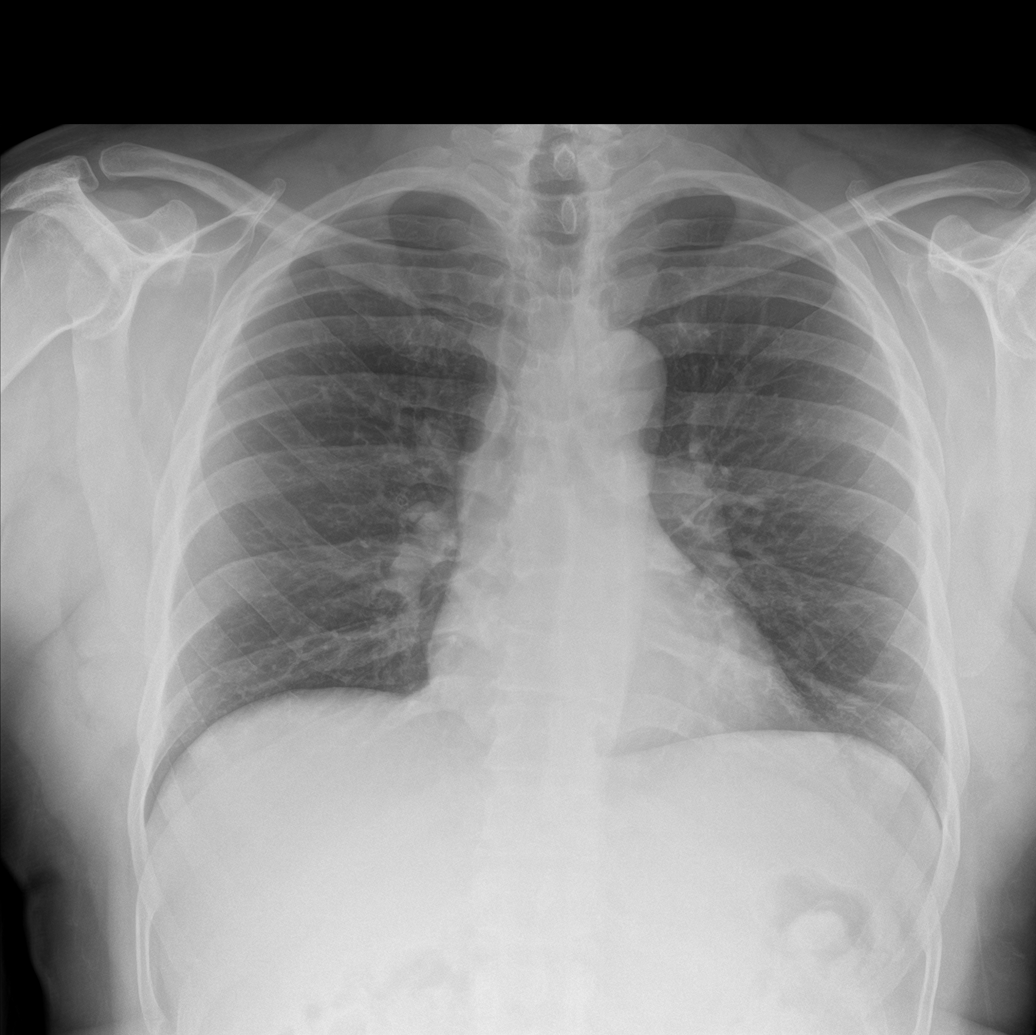

[abdomen erect]
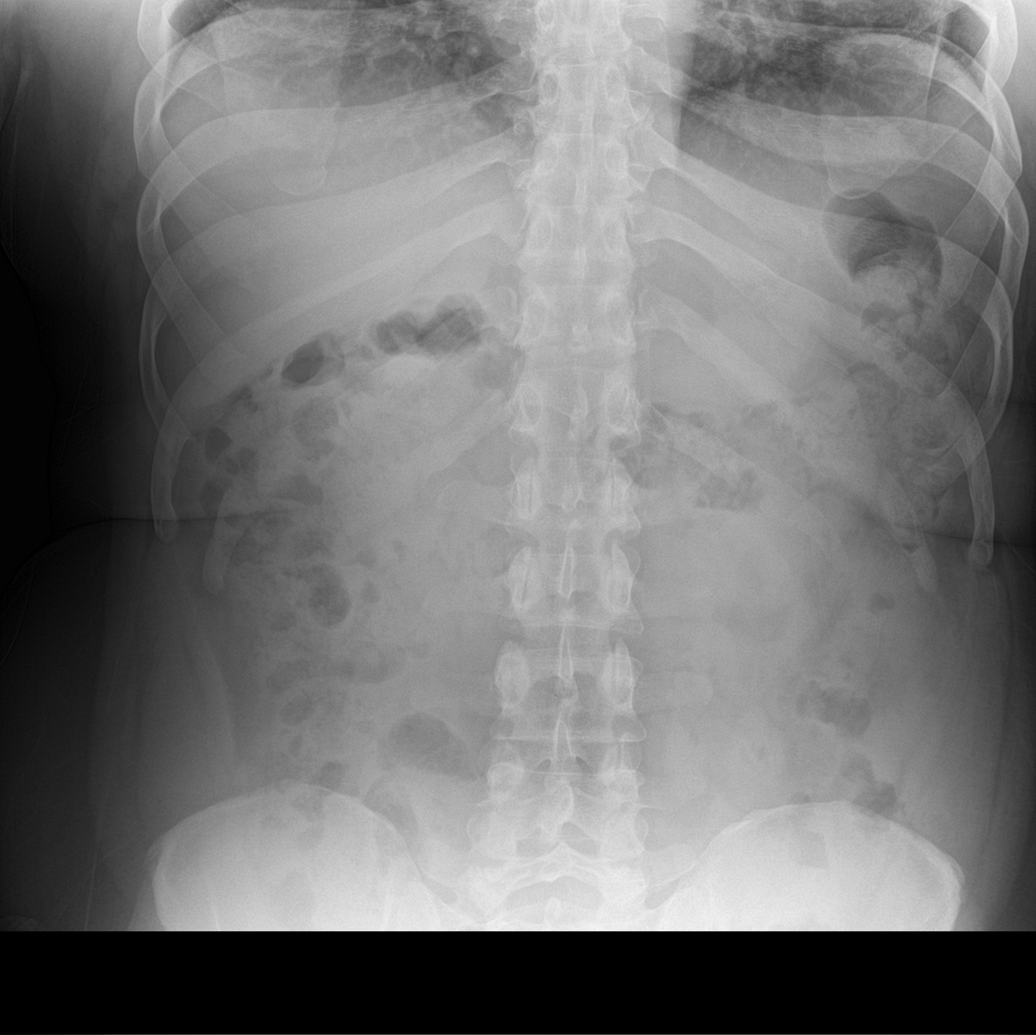

[abdomen supine (1 of 2)]
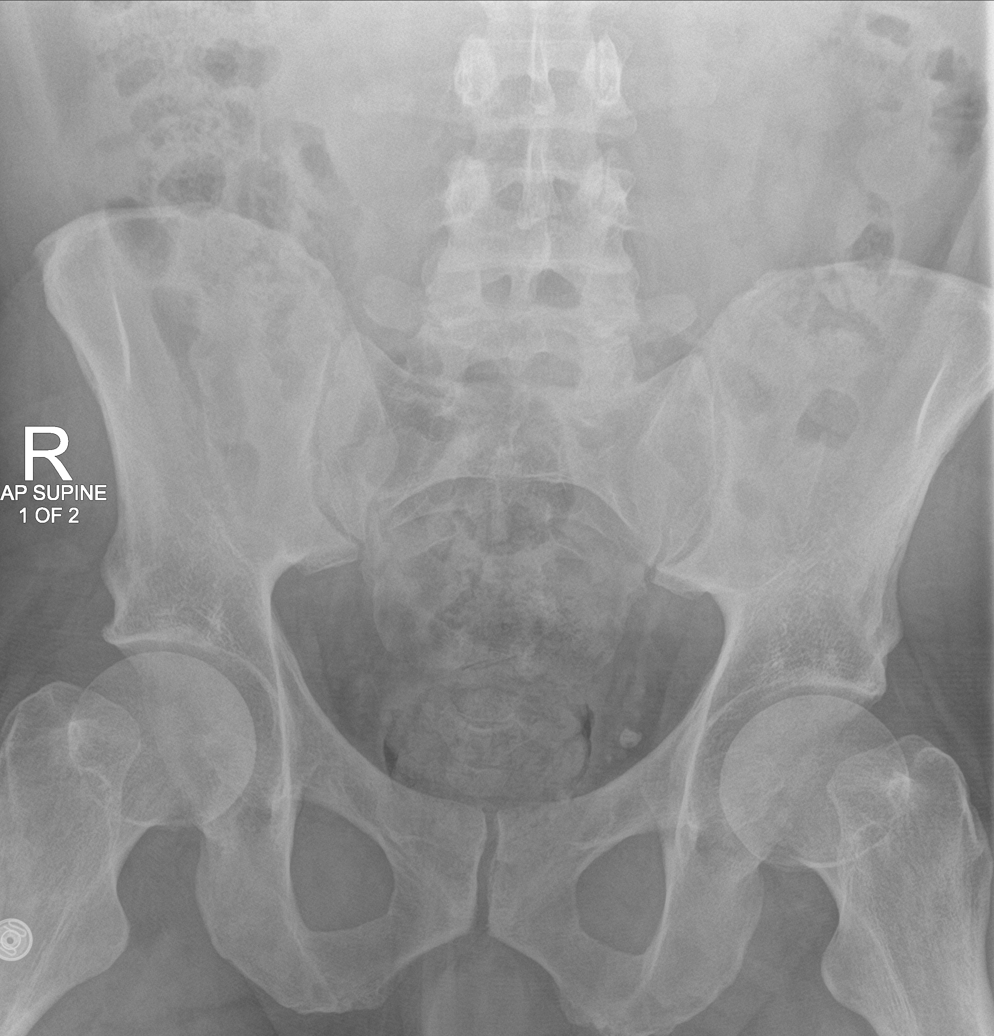

[abdomen supine (2 of 2)]
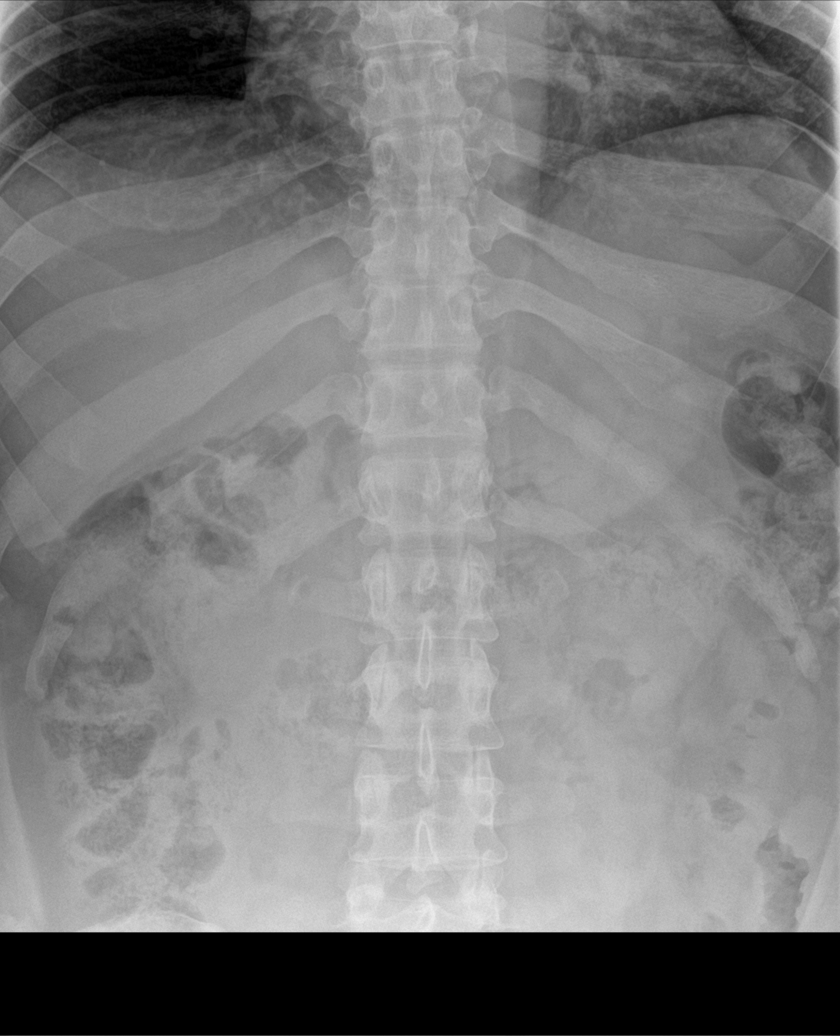

[4 of 4 positions shown; findings below may reference images not displayed]

FINDINGS: Lungs are clear. Mild left basilar scarring/atelectasis. No pleural
effusion or pneumothorax.

The heart is normal in size.

Nonobstructive bowel gas pattern.

No evidence of free air under the diaphragm on the upright view.

Normal colonic stool burden.

Visualized osseous structures are within normal limits.
IMPRESSION: No evidence of acute cardiopulmonary disease.

No evidence of small bowel obstruction or free air.

## 2019-11-27 ENCOUNTER — Encounter: Payer: Self-pay | Admitting: Internal Medicine

## 2020-01-03 ENCOUNTER — Other Ambulatory Visit: Payer: Self-pay | Admitting: Nurse Practitioner

## 2020-02-11 ENCOUNTER — Telehealth: Payer: Self-pay | Admitting: Internal Medicine

## 2020-02-11 MED ORDER — OMEPRAZOLE 20 MG PO CPDR: DELAYED_RELEASE_CAPSULE | ORAL | 3 refills | Status: DC

## 2020-02-11 NOTE — Addendum Note (Signed)
Addended by: Annitta Needs on: 02/11/2020 03:20 PM   Modules accepted: Orders

## 2020-02-11 NOTE — Telephone Encounter (Signed)
Done

## 2020-02-11 NOTE — Telephone Encounter (Signed)
Patient needs his Omeprazole sent to [facility] in [place] for a refill

## 2020-11-08 ENCOUNTER — Other Ambulatory Visit: Payer: Self-pay | Admitting: Gastroenterology

## 2020-11-09 ENCOUNTER — Encounter: Payer: Self-pay | Admitting: Internal Medicine

## 2020-11-09 NOTE — Telephone Encounter (Signed)
I provided refills but needs office visit for follow-up routinely.

## 2023-02-08 ENCOUNTER — Encounter (INDEPENDENT_AMBULATORY_CARE_PROVIDER_SITE_OTHER): Payer: Self-pay | Admitting: *Deleted

## 2023-08-21 ENCOUNTER — Encounter (INDEPENDENT_AMBULATORY_CARE_PROVIDER_SITE_OTHER): Payer: Self-pay | Admitting: *Deleted
